# Patient Record
Sex: Female | Born: 1935 | Race: White | Hispanic: No | Smoking: Never smoker
Health system: Southern US, Community
[De-identification: ages and names within clinical notes are randomized; demographics above are authoritative.]

## PROBLEM LIST (undated history)

## (undated) DIAGNOSIS — M810 Age-related osteoporosis without current pathological fracture: Secondary | ICD-10-CM

## (undated) DIAGNOSIS — J189 Pneumonia, unspecified organism: Secondary | ICD-10-CM

## (undated) DIAGNOSIS — C189 Malignant neoplasm of colon, unspecified: Secondary | ICD-10-CM

## (undated) DIAGNOSIS — I10 Essential (primary) hypertension: Secondary | ICD-10-CM

## (undated) DIAGNOSIS — M1990 Unspecified osteoarthritis, unspecified site: Secondary | ICD-10-CM

## (undated) DIAGNOSIS — K219 Gastro-esophageal reflux disease without esophagitis: Secondary | ICD-10-CM

## (undated) DIAGNOSIS — F419 Anxiety disorder, unspecified: Secondary | ICD-10-CM

## (undated) HISTORY — DX: Essential (primary) hypertension: I10

## (undated) HISTORY — DX: Age-related osteoporosis without current pathological fracture: M81.0

## (undated) HISTORY — PX: VAGINAL HYSTERECTOMY: SUR661

## (undated) HISTORY — PX: LAPAROSCOPIC CHOLECYSTECTOMY: SUR755

## (undated) HISTORY — DX: Malignant neoplasm of colon, unspecified: C18.9

---
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
## 1978-12-10 HISTORY — PX: COLECTOMY: SHX59

## 1981-12-10 HISTORY — PX: HAND NERVE REPAIR: SHX1728

## 1996-12-10 HISTORY — PX: RECTAL SURGERY: SHX760

## 1997-12-10 HISTORY — PX: KNEE ARTHROSCOPY: SHX127

## 2001-03-14 ENCOUNTER — Encounter: Payer: Self-pay | Admitting: General Surgery

## 2001-03-19 ENCOUNTER — Ambulatory Visit (HOSPITAL_COMMUNITY): Admission: RE | Admit: 2001-03-19 | Discharge: 2001-03-19 | Payer: Self-pay | Admitting: General Surgery

## 2001-03-19 ENCOUNTER — Encounter (INDEPENDENT_AMBULATORY_CARE_PROVIDER_SITE_OTHER): Payer: Self-pay | Admitting: Specialist

## 2007-01-16 ENCOUNTER — Ambulatory Visit: Payer: Self-pay | Admitting: Cardiology

## 2007-01-24 ENCOUNTER — Ambulatory Visit: Payer: Self-pay | Admitting: Cardiology

## 2007-02-17 ENCOUNTER — Ambulatory Visit: Payer: Self-pay | Admitting: Cardiology

## 2007-06-05 ENCOUNTER — Ambulatory Visit: Payer: Self-pay | Admitting: Cardiology

## 2007-09-08 ENCOUNTER — Ambulatory Visit: Payer: Self-pay | Admitting: Cardiology

## 2008-12-10 HISTORY — PX: CATARACT EXTRACTION W/ INTRAOCULAR LENS  IMPLANT, BILATERAL: SHX1307

## 2011-04-24 NOTE — Assessment & Plan Note (Signed)
[facility]                          [facility] NOTE   NAME:[name], [name] [name]                MRN:          [id number]  DATE:09/08/2007                            DOB:          December 09, 1932    HISTORY OF PRESENT ILLNESS:  The patient is a 77 year old female with a  history of abnormal stress test.  The patient is felt to have a possible  ischemic cardiomyopathy, although stress test was low risk, and the  patient has, in the past, deferred a cardiac catheterization.  She is  doing well from a cardiovascular perspective.  She does report right-  sided chest pain which starts underneath the right breast and radiates  to the back.  She states it is worse on rotational movements and when  she does cooking, particularly when she rotates her body to the right  side.  There is no definite exertional chest pain, nor is there  exertional dyspnea.  The patient reports no palpitations or syncope or  diaphoresis.   MEDICATIONS:  1. Evista 60 mg p.o. daily.  2. Fluoxetine 20 mg p.o. daily.  3. Os-Cal 500 mg tablets x1 daily.  4. Vitamin E 400 mg daily.  5. Prilosec over the counter.  6. Lisinopril 20 mg p.o. b.i.d.  7. Bisoprolol hydrochlorothiazide 2.5/6.25 two q.a.m.  8. Aspirin 81 mg p.o. daily.   PHYSICAL EXAMINATION:  VITAL SIGNS:  Blood pressure 123/79, heart rate  56 beats per minute.  GENERAL:  A well-nourished white female in no apparent distress.  HEENT:  Pupils isocoric.  Conjunctivae clear.  NECK:  Normal carotid upstrokes, no carotid bruits.  LUNGS:  Clear breath sounds bilaterally.  HEART:  Regular rate and rhythm.  Normal S1, S2.  No murmurs, rubs, or  gallops.  ABDOMEN:  Soft, nontender, no rebound or guarding.  Good bowel sounds.  EXTREMITY EXAM:  No cyanosis, clubbing, or edema.  NEURO:  Patient alert, oriented and grossly nonfocal.   PROBLEM LIST:  1. Presumed ischemic cardiomyopathy but low risk.  2. The patient  deferred cardiac catheterization in the past.  3. Asymptomatic from a cardiovascular perspective at the time.  4. Right-sided flank pain, rule out spinal injury/entrapment      neuropathy.  5. Chronic __________.  6. Dyslipidemia.   PLAN:  1. The patient will have an x-ray done of the spine.  2. I do not think the patient's pain is related to cardiac issues.      She is quite functional and she      has no exertional symptoms.  3. The patient will follow up with [place] in 6 months.     Learta Codding, MD,FACC  Electronically Signed    GED/MedQ  DD: 09/08/2007  DT: 09/08/2007  Job #: [id number]

## 2011-04-24 NOTE — Assessment & Plan Note (Signed)
[facility]                          [facility] NOTE   NAME:[name], [name] POPESCU                MRN:          [id number]  DATE:06/05/2007                            DOB:          1932/06/24    PRIMARY CARDIOLOGIST:  Learta Codding, MD, [facility]   REASON FOR VISIT:  Scheduled clinic followup. Please refer to Dr.  Margarita Mail previous clinic note of March 10, for full details.   The patient returns to the clinic for continued close monitoring and  management of presumed coronary artery disease. When last seen in the  clinic, Dr.  Andee Lineman noted that the patient most likely had underlying  coronary artery disease noting a wall motion abnormality both at rest  and on exertion with a dobutamine stress echocardiogram. Ejection  fraction was estimated at 40% at baseline. Nevertheless, this was felt  to exhibit no definite evidence of ischemia and that overall it was a  low risk study.   Medications were adjusted, however, with substitution of Zestril with  lisinopril 20 daily and Toprol with hydrochlorothiazide 25 daily. She  subsequently had further adjustment with up titration of lisinopril 20  b.i.d. for better blood pressure control. Followup blood work showed  normal renal function.   Clinically, the patient remains stable with no interim worsening of the  exertional chest weakness with which she initially presented to [place]  back in February of this year. Additionally, she denies that she  experiences anything which would be characterized as chest pain.   The patient also closely monitors her blood pressure at home and notes  significant improvement with our recent adjustment of her medication  regimen.   CURRENT MEDICATIONS:  1. Aspirin 81 daily.  2. Bisoprolol/hydrochlorothiazide 25/6.25 mg two tablets q a.m.  3. Lisinopril 20 mg b.i.d.  4. Prilosec over-the-counter 20 mg daily.  5. Fluoxetine 20 mg daily.  6. Evista 60 mg daily.   PHYSICAL EXAMINATION:  Blood pressure 117/74, pulse 60 and regular,  weight is 144.  GENERAL:  A 77 year old female sitting upright in no distress.  HEENT: Normocephalic, atraumatic.  NECK: Palpable bilateral carotid pulses without bruits; no JVD at 90  degrees.  LUNGS:  Clear to auscultation in all fields.  HEART: Regular rate and rhythm (S1, S2). No significant murmurs.  ABDOMEN: Benign.  EXTREMITIES: Palpable pulses with trace edema.  NEURO: No focal deficit.   IMPRESSION:  1. Presumed ischemic cardiomyopathy.      a.     Ejection fraction of 40% by recent low risk dobutamine       stress echo February 2008.      b.     No definite ischemia.  2. Hypertension.      a.     Much improved on current medication regimen.  3. Chronic left bundle branch block.  4. Hyperlipidemia.   PLAN:  The patient continues to remain hesitant about proceeding with a  diagnostic cardiac catheterization for definitive exclusion of  significant coronary artery disease. However, she has agreed to closely  monitor herself for any worsening of her symptoms and to contact our  office immediately. In the  interim, she has agreed to take a  prescription for nitroglycerin in the event that she were to have chest  pain unrelieved by rest.   We will continue closely monitoring her here in the clinic and will plan  on having her return in approximately 3 months for a followup. At that  time, we may consider adding a statin to her current regimen given her  most recent lipid profile revealing an LDL of 107 with an HDL of 54 and  a total cholesterol of 183.      Gene Serpe, PA-C  Electronically Signed      Learta Codding, MD,FACC  Electronically Signed   GS/MedQ  DD: 06/05/2007  DT: 06/05/2007  Job #: [id number]   cc:   Doreen Beam

## 2011-04-25 ENCOUNTER — Other Ambulatory Visit (HOSPITAL_COMMUNITY): Payer: Self-pay | Admitting: Internal Medicine

## 2011-04-25 DIAGNOSIS — IMO0002 Reserved for concepts with insufficient information to code with codable children: Secondary | ICD-10-CM

## 2011-04-26 ENCOUNTER — Ambulatory Visit (HOSPITAL_COMMUNITY)
Admission: RE | Admit: 2011-04-26 | Discharge: 2011-04-26 | Disposition: A | Discharge status: Home / Self Care | Payer: Medicare Other | Source: Ambulatory Visit | Attending: Internal Medicine | Admitting: Internal Medicine

## 2011-04-26 DIAGNOSIS — IMO0002 Reserved for concepts with insufficient information to code with codable children: Secondary | ICD-10-CM

## 2011-04-27 ENCOUNTER — Other Ambulatory Visit (HOSPITAL_COMMUNITY): Payer: Self-pay | Admitting: Interventional Radiology

## 2011-04-27 DIAGNOSIS — IMO0002 Reserved for concepts with insufficient information to code with codable children: Secondary | ICD-10-CM

## 2011-04-27 NOTE — Assessment & Plan Note (Signed)
[facility]                          [facility]   NAME:[name] [name], [name] [name]                MRN:          [id number]  DATE:01/16/2007                            DOB:          05-Oct-1932    REFERRING PHYSICIAN:  Dhruv Vyas   REASON FOR CONSULTATION:  [name] [name] is 77 year old female, with no  documented history of coronary artery disease, but with reportedly  negative pharmacologic stress test approximately 10 years ago. She has  not had any subsequent followup in our clinic since then. She is now  referred for evaluation of exertional dyspnea and atypical chest pain.   The patient is a somewhat difficult historian and refers to a sensation  described as a weakness in her chest, which appears to be precipitated  by prolonged standing (for example in the kitchen after preparing a  large meal). When queried specifically about any exertion-related chest  discomfort, however, she is not able to recall any such symptoms  ascribed to any type of activity. In fact, she continues to walk on a  regular basis, albeit at a moderately slow speed.   The patient denies any prior history of myocardial infarction,  congestive heart failure, or stroke. She does, however, tell me that she  has had a bundle branch for the past 15 years or so. Of note, however,  we have no old EKGs for comparison.   ALLERGIES:  SULFA.   CURRENT MEDICATIONS:  1. Toprol XL 50 daily.  2. Zestril daily.  3. Fluoxetine 20 daily.  4. Evista 60 daily.  5. Prilosec OTC 20 daily.   PAST MEDICAL HISTORY:  1. History of right lung nodule.      a.     Followed by Dr. Sherril Croon.  2. History of colon cancer.  3. Osteoporosis.  4. Hypertension.  5. GERD.  6. Arthritis.  7. Remote hemorrhoidectomy.  8. Laparoscopic cholecystectomy 2006.  9. Chronic left bundle branch block.  10.History of hyperlipidemia.   SOCIAL HISTORY:  The patient lives alone. She has one daughter  and two  grandchildren. She has never smoked tobacco on a regular basis. Denies  alcohol use.   FAMILY HISTORY:  Mother deceased age 31, unknown causes but with history  of diabetes mellitus and peripheral vascular disease. Father deceased  secondary to prostate cancer.   REVIEW OF SYSTEMS:  As noted per HPI, occasional heartburn symptoms, no  PND, orthopnea, or lower extremity edema. Remaining systems negative.   PHYSICAL EXAMINATION:  VITAL SIGNS:  Blood pressure 158/90, pulse 60,  regular, weight 139.8.  GENERAL:  A 77 year old female sitting upright in no distress.  HEENT:  Normocephalic, atraumatic.  NECK:  Palpable bilateral carotid pulses without bruits; no JVD.  LUNGS:  Clear to auscultation bilaterally.  HEART:  Regular rate and rhythm (S1, S2), no significant murmurs. No  rubs or gallops.  ABDOMEN:  Soft, nontender with intact bowel sounds.  EXTREMITIES:  Palpable bilateral femoral pulses and distal pulses with  trace pedal edema.  NEUROLOGIC:  Flat affect, but no focal deficit.   Electrocardiogram  today reveals sinus bradycardia at 54 bpm with left  axis deviation/LAHB; left bundle branch block.   IMPRESSION:  [name] [name] is a 77 year old female, with no documented  history of coronary artery disease, but several cardiac risk factors,  and reportedly negative prior ischemic workup with remote pharmacologic  stress test, now referred for evaluation of possible chest discomfort.   The patient presents with an abnormal electrocardiogram revealing left  bundle branch block, which is reportedly chronic, but for which we have  no previous EKGs for comparison.   PLAN:  Recommendation is to proceed with further workup with a  dobutamine echocardiogram for risk stratification. This will also  provide assessment of left ventricular function and rule out of any  underlying structural abnormalities. At that time, we will also check a  fasting lipid profile. We  will also  start the patient on low-dose aspirin. We will then arrange  for patient to return to our clinic for followup and review of study  results, and further recommendations.     Gene Serpe, PA-C  Electronically Signed      Learta Codding, MD,FACC  Electronically Signed   GS/MedQ  DD: 01/16/2007  DT: 01/16/2007  Job #: [id number]   cc:   Doreen Beam

## 2011-04-27 NOTE — Op Note (Signed)
[facility]  Patient:    [name] [name], [name] [name]                MRN: [id number]. Date: 03/19/01 Adm. Date:  [id number] Attending:  Carson Myrtle                           Operative Report  PREOPERATIVE DIAGNOSES:  1. Anal stenosis. 2. Hemorrhoids.  POSTOPERATIVE DIAGNOSES:  1. Anal stenosis. 2. Hemorrhoids.  OPERATIVE PROCEDURE:  1. Examination under anesthesia. 2. Hemorrhoidectomy. 3. Dilation of anal stenosis.  SURGEON:  Timothy E. Earlene Plater, M.D.  ANESTHESIA:  C.R.N.A. supervised Lestine Box, M.D.  INDICATIONS:  [name] [name] has been seen and followed in the office. She has had previous minor rectal procedures and she presents with a recurrence of anal stenosis now of a degree that interferes with her daily activity and bowel elimination. Since she has stenosis, it is very difficult to treat her in the office and she has elected to proceed with surgical intervention. She also has a large external hemorrhoid that she wanted removed at the same time.   DESCRIPTION OF PROCEDURE:  The patient is brought to the operating room, placed supine and LMA anesthesia provided. She was placed in lithotomy and perianal area inspected and prepped and draped in the usual fashion.  A large external hemorrhoid was present in the right anterior position. Anal stenosis was moderate to severe. I injected round and about with 0.50% Marcaine with epinephrine and massaged it in well. I was able to dilate to 2 fingers but could not insert the adult anoscope. I therefore did a left posterior internal sphincterotomy with careful dilatation. The anoscope was then accepted. A prolapsed mucosal fold was present in the anterior position of the rectal mucosa. I removed the external hemorrhoid in a circumferential fashion so as not to cross the anoderm and risk scarring and more stenosis. I undermined those edges and closed the wound circumferentially as well.  This was done with a 3-0 chromic. I then used a 2-0 chromic and oversewed the anterior mucosal fold so as to treat it like a rubber band ligation. This completed the procedure. There were no complications and no other pathology noted. Gelfoam, gauze and dry, sterile dressing applied. She tolerated it well and was removed to the recovery room in good condition.  DISPOSITION:  Written and verbal instructions were given to her and her daughter. She will be seen and followed as an outpatient.DD:  03/19/01 TD:  03/19/01 Job: [id number]

## 2011-04-27 NOTE — Assessment & Plan Note (Signed)
[facility]                          [facility]   NAME:Bathe, KIERRE DEINES                MRN:          [id number]  DATE:02/17/2007                            DOB:          [birth year]    HISTORY OF PRESENT ILLNESS:  The patient is a 77 year old female with no  history of documented coronary artery disease.  She was last seen by  Gene Serpe in the office on January 16, 2007.  She was found to have a  left bundle branch block.  This had been documented several years ago.  The patient described a sensation of weakness in the chest as well as  some exertional dyspnea.  A stress echocardiographic study was performed  demonstrating baseline abnormal LV function, ejection fraction 40%.  However, there was no definite ischemia seen with basically overall low-  risk study.  I did feel that the patient developed coronary artery  disease due to an underlying wall motion abnormality which was present  both at rest and on exertion.   The patient presents now for followup.  She states she has been doing  well.  Her blood pressure, however, appears to be uncontrolled.  Blood  pressure was noted to be 147/74 mmHg, heart rate 60 beats per minute.   PHYSICAL EXAMINATION:  VITAL SIGNS: Blood pressure 147/74, heart rate  60.  Weight 142 pounds.  NECK:  Normal carotid upstroke, no carotid bruits.  LUNGS:  Clear breath sounds bilaterally.  HEART:  Regular rate and rhythm.  Normal S1 and S2.  No murmur or  gallops.  ABDOMEN:  Soft, nontender without rebound or guarding.  Good bowel  sounds.  EXTREMITIES:  No cyanosis, clubbing, or edema.  NEUROLOGIC: The patient is alert and oriented and grossly nonfocal.   PROBLEM LIST:  1. Abnormal dobutamine echocardiographic study.      a.     Ejection fraction 40%.      b.     Low risk with no new wall motion abnormalities.  2. Dyspnea.  3. Hypertension, poorly controlled.  4. Left bundle branch block,  chronic.   PLAN:  1. The patient likely has coronary artery disease, but she is actually      doing quite well.  She has mild dyspnea.  2. No definite evidence of active ischemia with chest pain.  I feel      the patient can be started on aspirin.  3. The patient's medical therapy should be modified, and we will add      hydrochlorothiazide to her medical regimen for better blood      pressure control.  4. I changed Zestril to lisinopril 20 a day and Toprol to      hydrochlorothiazide 25 mg p.o. daily.  5. The patient will follow up with [place] in the next couple of months. If      she becomes more symptomatic, sooner.  Catheterization can be      entertained at that time.     Learta Codding, MD,FACC  Electronically Signed    GED/MedQ  DD: 02/17/2007  DT: 02/17/2007  Job #: [id number]

## 2011-05-01 ENCOUNTER — Ambulatory Visit (HOSPITAL_COMMUNITY)
Admission: RE | Admit: 2011-05-01 | Discharge: 2011-05-01 | Disposition: A | Discharge status: Home / Self Care | Payer: Medicare Other | Source: Ambulatory Visit | Attending: Interventional Radiology | Admitting: Interventional Radiology

## 2011-05-01 ENCOUNTER — Other Ambulatory Visit (HOSPITAL_COMMUNITY): Payer: Self-pay | Admitting: Interventional Radiology

## 2011-05-01 DIAGNOSIS — IMO0002 Reserved for concepts with insufficient information to code with codable children: Secondary | ICD-10-CM

## 2011-05-01 DIAGNOSIS — X58XXXA Exposure to other specified factors, initial encounter: Secondary | ICD-10-CM | POA: Insufficient documentation

## 2011-05-01 DIAGNOSIS — S22009A Unspecified fracture of unspecified thoracic vertebra, initial encounter for closed fracture: Secondary | ICD-10-CM | POA: Insufficient documentation

## 2011-05-01 DIAGNOSIS — Z79899 Other long term (current) drug therapy: Secondary | ICD-10-CM | POA: Insufficient documentation

## 2011-05-01 DIAGNOSIS — Z01812 Encounter for preprocedural laboratory examination: Secondary | ICD-10-CM | POA: Insufficient documentation

## 2011-05-01 LAB — CBC
HCT: 38.3 % (ref 36.0–46.0)
Hemoglobin: 13.1 g/dL (ref 12.0–15.0)
MCH: 30.8 pg (ref 26.0–34.0)
MCHC: 34.2 g/dL (ref 30.0–36.0)
MCV: 90.1 fL (ref 78.0–100.0)
Platelets: 288 10*3/uL (ref 150–400)
RBC: 4.25 MIL/uL (ref 3.87–5.11)
RDW: 12.8 % (ref 11.5–15.5)
WBC: 5 10*3/uL (ref 4.0–10.5)

## 2011-05-01 LAB — POCT I-STAT, CHEM 8
BUN: 15 mg/dL (ref 6–23)
Calcium, Ion: 1.19 mmol/L (ref 1.12–1.32)
Chloride: 105 mEq/L (ref 96–112)
Potassium: 3.9 mEq/L (ref 3.5–5.1)

## 2011-05-01 LAB — APTT: aPTT: 29 seconds (ref 24–37)

## 2011-05-01 MED ORDER — IOHEXOL 300 MG/ML  SOLN: 50.0000 mL | Freq: Once | INTRAMUSCULAR | Status: AC | PRN

## 2011-05-15 ENCOUNTER — Other Ambulatory Visit (HOSPITAL_COMMUNITY): Payer: Self-pay | Admitting: Interventional Radiology

## 2011-05-15 DIAGNOSIS — IMO0002 Reserved for concepts with insufficient information to code with codable children: Secondary | ICD-10-CM

## 2011-05-18 ENCOUNTER — Ambulatory Visit (HOSPITAL_COMMUNITY)
Admission: RE | Admit: 2011-05-18 | Discharge: 2011-05-18 | Disposition: A | Discharge status: Home / Self Care | Payer: Medicare Other | Source: Ambulatory Visit | Attending: Interventional Radiology | Admitting: Interventional Radiology

## 2011-05-18 DIAGNOSIS — IMO0002 Reserved for concepts with insufficient information to code with codable children: Secondary | ICD-10-CM

## 2012-06-18 ENCOUNTER — Telehealth (HOSPITAL_COMMUNITY): Payer: Self-pay

## 2012-06-18 NOTE — Telephone Encounter (Signed)
[name] [name] called the office today.  She stated that her regular doctor took xrays that didn't show any type of new compression fracture but, she stated that her back is hurting.  After asking her where the pain was, she could not  Specify an exact spot  I asked her was it above or below her bra strap.. She stated I dont know.  Then she stated that her neck hurt.  I asked her was it at the base of her neck to her bra strap or higher than her shoulders.  She stated I dont know.  She said she would give it a couple of days and possibly call back.  I told her to just give [place] a call.

## 2012-08-18 ENCOUNTER — Other Ambulatory Visit (HOSPITAL_COMMUNITY): Payer: Self-pay | Admitting: Interventional Radiology

## 2012-08-18 DIAGNOSIS — M549 Dorsalgia, unspecified: Secondary | ICD-10-CM

## 2012-08-19 ENCOUNTER — Other Ambulatory Visit (HOSPITAL_COMMUNITY): Payer: Self-pay | Admitting: Interventional Radiology

## 2012-08-19 ENCOUNTER — Ambulatory Visit (HOSPITAL_COMMUNITY)
Admission: RE | Admit: 2012-08-19 | Discharge: 2012-08-19 | Disposition: A | Discharge status: Home / Self Care | Payer: Medicare Other | Source: Ambulatory Visit | Attending: Interventional Radiology | Admitting: Interventional Radiology

## 2012-08-19 ENCOUNTER — Telehealth (HOSPITAL_COMMUNITY): Payer: Self-pay

## 2012-08-19 DIAGNOSIS — IMO0002 Reserved for concepts with insufficient information to code with codable children: Secondary | ICD-10-CM

## 2012-08-19 DIAGNOSIS — M549 Dorsalgia, unspecified: Secondary | ICD-10-CM

## 2012-08-19 NOTE — Telephone Encounter (Signed)
The pt will be having a Bone Scan at [facility] 08-20-12.   Dr. Corliss Skains will be reviewing the results.

## 2012-08-20 ENCOUNTER — Encounter (HOSPITAL_COMMUNITY): Payer: Self-pay

## 2012-08-20 ENCOUNTER — Ambulatory Visit (HOSPITAL_COMMUNITY)
Admission: RE | Admit: 2012-08-20 | Discharge: 2012-08-20 | Disposition: A | Discharge status: Home / Self Care | Payer: Medicare Other | Source: Ambulatory Visit | Attending: Interventional Radiology | Admitting: Interventional Radiology

## 2012-08-20 ENCOUNTER — Encounter (HOSPITAL_COMMUNITY)
Admission: RE | Admit: 2012-08-20 | Discharge: 2012-08-20 | Disposition: A | Discharge status: Home / Self Care | Payer: Medicare Other | Source: Ambulatory Visit | Attending: Interventional Radiology | Admitting: Interventional Radiology

## 2012-08-20 DIAGNOSIS — M545 Low back pain, unspecified: Secondary | ICD-10-CM | POA: Insufficient documentation

## 2012-08-20 DIAGNOSIS — M549 Dorsalgia, unspecified: Secondary | ICD-10-CM

## 2012-08-20 DIAGNOSIS — IMO0002 Reserved for concepts with insufficient information to code with codable children: Secondary | ICD-10-CM

## 2012-08-20 MED ORDER — TECHNETIUM TC 99M MEDRONATE IV KIT
25.0000 | PACK | Freq: Once | INTRAVENOUS | Status: AC | PRN
  Administered 2012-08-20: 25 via INTRAVENOUS

## 2012-08-26 ENCOUNTER — Telehealth (HOSPITAL_COMMUNITY): Payer: Self-pay

## 2012-08-26 NOTE — Telephone Encounter (Signed)
I spoke with [name] [name] in regards to her Bone scan.  Per Dr. Corliss Skains there are no new fractures.  He advised for the pt to rest for a few weeks.  If her pain is not any better, go to her ortho.

## 2013-04-14 ENCOUNTER — Ambulatory Visit (INDEPENDENT_AMBULATORY_CARE_PROVIDER_SITE_OTHER): Payer: Medicare Other | Admitting: General Surgery

## 2013-04-14 ENCOUNTER — Encounter (INDEPENDENT_AMBULATORY_CARE_PROVIDER_SITE_OTHER): Payer: Self-pay | Admitting: General Surgery

## 2013-04-14 VITALS — BP 122/70 | HR 60 | Temp 97.5°F | Resp 12 | Ht 61.0 in | Wt 139.0 lb

## 2013-04-14 DIAGNOSIS — K6289 Other specified diseases of anus and rectum: Secondary | ICD-10-CM

## 2013-04-14 DIAGNOSIS — K5900 Constipation, unspecified: Secondary | ICD-10-CM | POA: Insufficient documentation

## 2013-04-14 NOTE — Patient Instructions (Signed)
Continue Miralax.  Call the office if your symptoms get worse.

## 2013-04-14 NOTE — Progress Notes (Signed)
Chief Complaint  Patient presents with  . Rectal Problems    HISTORY: [name] [name] is a 79 y.o. female who presents to the office with rectal pain.  She has a history of constipation, but this is better since being on miralax.  She is s/p surgery by Dr Earlene Plater in the late 90's after an episode and severe anal pain.  This was better until the last few months.  She was place on diltiazem cream by Dr Clent Ridges but she had an episode of hypotension with this and stopped.  Constipation makes the symptoms worse.   It is intermittent in nature.  Her bowel habits are regular and her bowel movements are soft.  Her fiber intake is moderate.  She has a feeling of incomplete evacuation at times.  She was having some fecal leakage as well, but this has gotten better.  She denies prolapsing tissue.     Past Medical History  Diagnosis Date  . Arthritis   . Osteoporosis   . Cancer     colon  . Hypertension       Past Surgical History  Procedure Laterality Date  . Cholecystectomy    . Colon surgery  1980  . Rectal surgery  1998  . Cataract extraction  2010  . Knee surgery  1999  . Hand surgery  1983        Current Outpatient Prescriptions  Medication Sig Dispense Refill  . bisoprolol-hydrochlorothiazide (ZIAC) 2.5-6.25 MG per tablet Take 2 tablets by mouth daily.      . calcium-vitamin D (OSCAL WITH D) 500-200 MG-UNIT per tablet Take 1 tablet by mouth daily.      . Cholecalciferol (VITAMIN D-3 PO) Take 2,000 mg by mouth daily.      Marland Kitchen HYDROcodone-acetaminophen (NORCO/VICODIN) 5-325 MG per tablet Take 1 tablet by mouth every 6 (six) hours as needed for pain.      Marland Kitchen lisinopril (PRINIVIL,ZESTRIL) 20 MG tablet Take 20 mg by mouth 2 (two) times daily.      Marland Kitchen LORazepam (ATIVAN) 1 MG tablet Take 1 mg by mouth every 8 (eight) hours as needed for anxiety.      Marland Kitchen omeprazole (PRILOSEC) 20 MG capsule Take 20 mg by mouth daily.      . polyethylene glycol (MIRALAX / GLYCOLAX) packet Take 17 g by mouth  daily. One tablespoon a day      . vitamin E (VITAMIN E) 400 UNIT capsule Take 400 Units by mouth daily.       No current facility-administered medications for this visit.      Allergies  Allergen Reactions  . Sulfa Drugs Cross Reactors Hives      No family history on file.  History   Social History  . Marital Status: Divorced    Spouse Name: N/A    Number of Children: N/A  . Years of Education: N/A   Social History Main Topics  . Smoking status: Never Smoker   . Smokeless tobacco: None  . Alcohol Use: No  . Drug Use: No  . Sexually Active: None   Other Topics Concern  . None   Social History Narrative  . None      REVIEW OF SYSTEMS - PERTINENT POSITIVES ONLY: Review of Systems - General ROS: negative for - chills, fever or weight loss Hematological and Lymphatic ROS: negative for - bleeding problems, blood clots or bruising Respiratory ROS: no cough, shortness of breath, or wheezing Cardiovascular ROS: no chest pain or dyspnea on exertion  Gastrointestinal ROS: no abdominal pain, change in bowel habits, or black or bloody stools Genito-Urinary ROS: no dysuria, trouble voiding, or hematuria  EXAM: Filed Vitals:   04/14/13 1101  BP: 122/70  Pulse: 60  Temp: 97.5 F (36.4 C)  Resp: 12    General appearance: alert and cooperative Resp: clear to auscultation bilaterally Cardio: regular rate and rhythm GI: soft, non-tender; bowel sounds normal; no masses,  no organomegaly  Anal exam Findings: large external skin tags, increased sphincter tone, no masses palapated, anal canal full of stool.  Unable to perform anoscopy.    ASSESSMENT AND PLAN: [name] [name] is a 79 y.o. F who has a longstanding history of constipation.  She has been having anal pain over the last few months, but this has gotten better on low dose Miralax.  Her physical exam is consistent with outlet obstruction.  Given that her symptoms are getting better with the miralax, it may be  better to continue on this path for now.  If her symptoms worsen, we could get a MR defagram to evaluate her pelvic physiology better.  If her anal pain gets worse, I will need to see her back and have her do an enema prior to the visit so that I can do a complete anoscopy.      Vanita Panda, MD Colon and Rectal Surgery / General Surgery [facility]      Visit Diagnoses: 1. Unspecified constipation   2. Anal pain     Primary Care Physician: Ignatius Specking., MD

## 2014-12-08 ENCOUNTER — Encounter (INDEPENDENT_AMBULATORY_CARE_PROVIDER_SITE_OTHER): Payer: Self-pay | Admitting: *Deleted

## 2015-01-13 ENCOUNTER — Ambulatory Visit (INDEPENDENT_AMBULATORY_CARE_PROVIDER_SITE_OTHER): Payer: Medicare Other | Admitting: Internal Medicine

## 2015-03-15 ENCOUNTER — Ambulatory Visit (INDEPENDENT_AMBULATORY_CARE_PROVIDER_SITE_OTHER): Payer: Medicare Other | Admitting: Internal Medicine

## 2015-03-15 ENCOUNTER — Encounter (INDEPENDENT_AMBULATORY_CARE_PROVIDER_SITE_OTHER): Payer: Self-pay | Admitting: Internal Medicine

## 2015-03-15 VITALS — BP 130/62 | HR 60 | Temp 97.5°F | Ht 60.0 in | Wt 127.0 lb

## 2015-03-15 DIAGNOSIS — K589 Irritable bowel syndrome without diarrhea: Secondary | ICD-10-CM | POA: Diagnosis not present

## 2015-03-15 DIAGNOSIS — K624 Stenosis of anus and rectum: Secondary | ICD-10-CM | POA: Diagnosis not present

## 2015-03-15 DIAGNOSIS — F5000 Anorexia nervosa, unspecified: Secondary | ICD-10-CM

## 2015-03-15 NOTE — Progress Notes (Signed)
Subjective:    Patient ID: [name] [name], female    DOB: 06/12/1932, 81 y.o.   MRN: [id number]  HPI Referred by Dr. Woody Seller for abdominal pain/IBS. She tells me around the first of the year she had abdominal pain which has now resolved. She tells me she has to strain to have a BM. Her stool are small.  Some times she passes feces when she voids. She has a hx of anal stenosis.  When she strains, she will have abdominal pain which does resolve. She usually has a BM daily. Her stools are soft. She takes Miralax daily.  She does not have to strain every time to have a BM.  She tells every since she had the virus in January she has to force her self to eat. She denies any epigastric pain.  She denies seeing any [facility] on melena.  Recent colonoscopy in 2015 normal except for a polyp. No biopsy sent.   Hx of colon cancer in 1980 at [facility] by Dr. Alfonse Ras.  Has been evaluated by Leighton Ruff MD at [facility] for constipation in 04/2013.   02/13/2015 H and H 13.6 and 39.6. Bili 0.6, ALP 94, AST 26, ALT 12.   06/28/2014 Colonoscopy: High risk colon cancer surveillance. Personal hx of colon cancer. Last colonoscopy 2009. [name] [name]. [place].  Anal stricture found on digital rectal exam. One 17mm polyp in the transverse colond.  Resected and retrieved.  Patient end to end colo-rectal anastomosis.  The distal rectum and anal verge are normal on rectflexion view.    03/19/2001 [name] MD. Anal stenosis. 2. Hemorrhoids A large external hemorrhoid was present in the right anterior position. Anal stenosis was moderate to severe. I injected round and about with 0.50% Marcaine with epinephrine and massaged it in well. I was able to dilate to 2 fingers but could not insert the adult anoscope. I therefore did a left posterior internal sphincterotomy with careful dilatation. The anoscope was then accepted. A prolapsed mucosal fold was present in the anterior position of the  rectal mucosa. I removed the external hemorrhoid in a circumferential fashion so as not to cross the anoderm and risk scarring and more stenosis. I undermined those edges and closed the wound circumferentially as well.    Review of Systems  Divorced. One child in good health.  Past Medical History  Diagnosis Date  . Arthritis   . Osteoporosis   . Cancer     colon  . Hypertension   . Colon cancer     1980    Past Surgical History  Procedure Laterality Date  . Cholecystectomy    . Colon surgery  1980  . Rectal surgery  1998  . Cataract extraction  2010  . Knee surgery  1999  . Hand surgery  1983    Allergies  Allergen Reactions  . Penicillins   . Sulfa Drugs Cross Reactors Hives    Current Outpatient Prescriptions on File Prior to Visit  Medication Sig Dispense Refill  . bisoprolol-hydrochlorothiazide (ZIAC) 2.5-6.25 MG per tablet Take 2 tablets by mouth daily.    . calcium-vitamin D (OSCAL WITH D) 500-200 MG-UNIT per tablet Take 1 tablet by mouth daily.    . Cholecalciferol (VITAMIN D-3 PO) Take 2,000 mg by mouth daily.    Marland Kitchen HYDROcodone-acetaminophen (NORCO/VICODIN) 5-325 MG per tablet Take 1 tablet by mouth every 6 (six) hours as needed for pain.    Marland Kitchen lisinopril (PRINIVIL,ZESTRIL) 20 MG tablet Take 20  mg by mouth 2 (two) times daily. 1/2 tab twice a day.    Marland Kitchen LORazepam (ATIVAN) 1 MG tablet Take 1 mg by mouth every 8 (eight) hours as needed for anxiety.    Marland Kitchen omeprazole (PRILOSEC) 20 MG capsule Take 20 mg by mouth daily.    . polyethylene glycol (MIRALAX / GLYCOLAX) packet Take 17 g by mouth daily. One tablespoon a day    . vitamin E (VITAMIN E) 400 UNIT capsule Take 400 Units by mouth daily.     No current facility-administered medications on file prior to visit.        Objective:   Physical Exam Blood pressure 130/62, pulse 60, temperature 97.5 F (36.4 C), height 5' (1.524 m), weight 127 lb (57.607 kg). Alert and oriented. Skin warm and dry. Oral mucosa is  moist.   . Sclera anicteric, conjunctivae is pink. Thyroid not enlarged. No cervical lymphadenopathy. Lungs clear. Heart regular rate and rhythm.  Abdomen is soft. Bowel sounds are positive. No hepatomegaly. No abdominal masses felt. No tenderness.  No edema to lower extremities. Rectal exam: I did not feel a rectal stricture on exam.         Assessment & Plan:  Anal stricture. I really did not feel a stricture today.  OV in 4 weeks. Continue the Miralax. Do not strain to have a BM.

## 2015-03-15 NOTE — Patient Instructions (Addendum)
OV in 4 weeks. Continue the Miralax

## 2015-03-24 ENCOUNTER — Encounter (INDEPENDENT_AMBULATORY_CARE_PROVIDER_SITE_OTHER): Payer: Self-pay

## 2015-04-07 ENCOUNTER — Telehealth (INDEPENDENT_AMBULATORY_CARE_PROVIDER_SITE_OTHER): Payer: Self-pay | Admitting: *Deleted

## 2015-04-07 NOTE — Telephone Encounter (Signed)
Per [name] [name], she is having a lot of back trouble and doesn't see why she needed a f/u at this time. She is also not sure if back surgery is needed. Will call back to get a f/u scheduled once the back issue has been resolved.

## 2015-04-07 NOTE — Telephone Encounter (Signed)
noted 

## 2015-08-20 ENCOUNTER — Emergency Department (HOSPITAL_COMMUNITY)
Admission: EM | Admit: 2015-08-20 | Discharge: 2015-08-20 | Disposition: A | Discharge status: Home / Self Care | Payer: Medicare Other | Attending: Emergency Medicine | Admitting: Emergency Medicine

## 2015-08-20 ENCOUNTER — Encounter (HOSPITAL_COMMUNITY): Payer: Self-pay | Admitting: Emergency Medicine

## 2015-08-20 DIAGNOSIS — Z79899 Other long term (current) drug therapy: Secondary | ICD-10-CM | POA: Insufficient documentation

## 2015-08-20 DIAGNOSIS — Z792 Long term (current) use of antibiotics: Secondary | ICD-10-CM | POA: Insufficient documentation

## 2015-08-20 DIAGNOSIS — G8929 Other chronic pain: Secondary | ICD-10-CM | POA: Insufficient documentation

## 2015-08-20 DIAGNOSIS — M1990 Unspecified osteoarthritis, unspecified site: Secondary | ICD-10-CM | POA: Diagnosis not present

## 2015-08-20 DIAGNOSIS — F419 Anxiety disorder, unspecified: Secondary | ICD-10-CM | POA: Insufficient documentation

## 2015-08-20 DIAGNOSIS — Z880 Allergy status to penicillin: Secondary | ICD-10-CM | POA: Diagnosis not present

## 2015-08-20 DIAGNOSIS — I10 Essential (primary) hypertension: Secondary | ICD-10-CM | POA: Diagnosis not present

## 2015-08-20 DIAGNOSIS — R52 Pain, unspecified: Secondary | ICD-10-CM | POA: Diagnosis present

## 2015-08-20 DIAGNOSIS — M791 Myalgia: Secondary | ICD-10-CM | POA: Insufficient documentation

## 2015-08-20 DIAGNOSIS — Z85038 Personal history of other malignant neoplasm of large intestine: Secondary | ICD-10-CM | POA: Insufficient documentation

## 2015-08-20 LAB — CBC WITH DIFFERENTIAL/PLATELET
BASOS PCT: 0 % (ref 0–1)
Basophils Absolute: 0 10*3/uL (ref 0.0–0.1)
EOS ABS: 0 10*3/uL (ref 0.0–0.7)
Eosinophils Relative: 0 % (ref 0–5)
HCT: 42.3 % (ref 36.0–46.0)
Hemoglobin: 14 g/dL (ref 12.0–15.0)
Lymphocytes Relative: 13 % (ref 12–46)
Lymphs Abs: 0.9 10*3/uL (ref 0.7–4.0)
MCH: 31 pg (ref 26.0–34.0)
MCHC: 33.1 g/dL (ref 30.0–36.0)
MCV: 93.8 fL (ref 78.0–100.0)
MONO ABS: 0.4 10*3/uL (ref 0.1–1.0)
MONOS PCT: 5 % (ref 3–12)
Neutro Abs: 5.6 10*3/uL (ref 1.7–7.7)
Neutrophils Relative %: 82 % — ABNORMAL HIGH (ref 43–77)
Platelets: 238 10*3/uL (ref 150–400)
RBC: 4.51 MIL/uL (ref 3.87–5.11)
RDW: 12.9 % (ref 11.5–15.5)
WBC: 6.9 10*3/uL (ref 4.0–10.5)

## 2015-08-20 LAB — BASIC METABOLIC PANEL
Anion gap: 10 (ref 5–15)
BUN: 15 mg/dL (ref 6–20)
CALCIUM: 9.1 mg/dL (ref 8.9–10.3)
CO2: 25 mmol/L (ref 22–32)
CREATININE: 0.79 mg/dL (ref 0.44–1.00)
Chloride: 104 mmol/L (ref 101–111)
GFR calc non Af Amer: >60 mL/min (ref >60)
Glucose, Bld: 94 mg/dL (ref 65–99)
Potassium: 3.9 mmol/L (ref 3.5–5.1)
SODIUM: 139 mmol/L (ref 135–145)

## 2015-08-20 LAB — URINE MICROSCOPIC-ADD ON

## 2015-08-20 LAB — URINALYSIS, ROUTINE W REFLEX MICROSCOPIC
BILIRUBIN URINE: NEGATIVE
GLUCOSE, UA: NEGATIVE mg/dL
KETONES UR: NEGATIVE mg/dL
Leukocytes, UA: NEGATIVE
NITRITE: NEGATIVE
PH: 8 (ref 5.0–8.0)
Protein, ur: NEGATIVE mg/dL
Specific Gravity, Urine: 1.01 (ref 1.005–1.030)
Urobilinogen, UA: 0.2 mg/dL (ref 0.0–1.0)

## 2015-08-20 LAB — CK: CK TOTAL: 108 U/L (ref 38–234)

## 2015-08-20 MED ORDER — GABAPENTIN 300 MG PO CAPS: 300.0000 mg | ORAL_CAPSULE | Freq: Every day | ORAL | Status: DC

## 2015-08-20 MED ORDER — HYDROCODONE-ACETAMINOPHEN 7.5-325 MG/15ML PO SOLN
10.0000 mL | Freq: Once | ORAL | Status: AC
  Administered 2015-08-20: 10 mL via ORAL
  Filled 2015-08-20: qty 15

## 2015-08-20 MED ORDER — GABAPENTIN 300 MG PO CAPS
300.0000 mg | ORAL_CAPSULE | Freq: Once | ORAL | Status: AC
  Administered 2015-08-20: 300 mg via ORAL
  Filled 2015-08-20: qty 1

## 2015-08-20 NOTE — ED Provider Notes (Signed)
CSN: [id number]     Arrival date & time 08/20/15  1155 History   First MD Initiated Contact with Patient 08/20/15 1207     Chief Complaint  Patient presents with  . Generalized Body Aches      HPI  Patient presents for evaluation for essentially 2 sayings ultimately. She states that she had a panic attack this morning. She states that she has a panic attack because she didn't sleep well last night. She states she didn't sleep well because she had pain in her legs. States she's had pain in her legs for many years. She states that she takes hydrocodone for it normally takes it in the evening in the morning. She saw her doctor on Tuesday for what sounds like multiple complaints. She states ultimately he was told she might have a bladder infection. She states that she was disappointed in that her doctor didn't "pinch my legs to see if the blood flow was okay".  She states that she called yesterday to send her legs were still hurting her. Her doctor called in a prescription for diclofenac cream which she has used before with success. She was going to the grocery as morning to pick Her prescription at the pharmacy when she states she felt like she had a panic attack and had a take after Ativan. She states that she feels like that episode has passed.  He reports that her current episode of pain is been worse since a 30 mile car trip months ago when the air conditioner was really cold on her legs.  Past Medical History  Diagnosis Date  . Arthritis   . Osteoporosis   . Cancer     colon  . Hypertension   . Colon cancer     1980   Past Surgical History  Procedure Laterality Date  . Cholecystectomy    . Colon surgery  1980  . Rectal surgery  1998  . Cataract extraction  2010  . Knee surgery  1999  . Hand surgery  1983   No family history on file. Social History  Substance Use Topics  . Smoking status: Never Smoker   . Smokeless tobacco: None  . Alcohol Use: No   OB History    No data  available     Review of Systems  Constitutional: Negative for fever, chills, diaphoresis, appetite change and fatigue.  HENT: Negative for mouth sores, sore throat and trouble swallowing.   Eyes: Negative for visual disturbance.  Respiratory: Negative for cough, chest tightness, shortness of breath and wheezing.   Cardiovascular: Negative for chest pain.  Gastrointestinal: Negative for nausea, vomiting, abdominal pain, diarrhea and abdominal distention.  Endocrine: Negative for polydipsia, polyphagia and polyuria.  Genitourinary: Negative for dysuria, frequency and hematuria.  Musculoskeletal: Positive for myalgias and arthralgias. Negative for gait problem.  Skin: Negative for color change, pallor and rash.  Neurological: Negative for dizziness, syncope, light-headedness and headaches.  Hematological: Does not bruise/bleed easily.  Psychiatric/Behavioral: Negative for behavioral problems and confusion. The patient is nervous/anxious.       Allergies  Penicillins and Sulfa drugs cross reactors  Home Medications   Prior to Admission medications   Medication Sig Start Date End Date Taking? Authorizing Provider  bisoprolol-hydrochlorothiazide (ZIAC) 2.5-6.25 MG per tablet Take 2 tablets by mouth daily.   Yes Historical Provider, MD  calcium-vitamin D (OSCAL WITH D) 500-200 MG-UNIT per tablet Take 1 tablet by mouth daily.   Yes Historical Provider, MD  Cholecalciferol (VITAMIN D-3 PO)  Take 2,000 mg by mouth daily.   Yes Historical Provider, MD  ciprofloxacin (CIPRO) 500 MG tablet Take 500 mg by mouth 2 (two) times daily.   Yes Historical Provider, MD  HYDROcodone-acetaminophen (NORCO) 7.5-325 MG per tablet Take 1 tablet by mouth every 12 (twelve) hours.   Yes Historical Provider, MD  lisinopril (PRINIVIL,ZESTRIL) 10 MG tablet Take 10 mg by mouth 2 (two) times daily.   Yes Historical Provider, MD  LORazepam (ATIVAN) 1 MG tablet Take 0.5-1 mg by mouth every 8 (eight) hours as needed for  anxiety.    Yes Historical Provider, MD  polyethylene glycol (MIRALAX / GLYCOLAX) packet Take 17 g by mouth daily. One tablespoon a day   Yes Historical Provider, MD  vitamin E (VITAMIN E) 400 UNIT capsule Take 400 Units by mouth daily.   Yes Historical Provider, MD  gabapentin (NEURONTIN) 300 MG capsule Take 1 capsule (300 mg total) by mouth at bedtime. 08/20/15   Tanna Furry, MD   BP 147/68 mmHg  Pulse 90  Temp(Src) 98 F (36.7 C)  Resp 18  Ht 5' (1.524 m)  Wt 125 lb (56.7 kg)  BMI 24.41 kg/m2  SpO2 94% Physical Exam  Constitutional: She is oriented to person, place, and time. She appears well-developed and well-nourished. No distress.  HENT:  Head: Normocephalic.  Eyes: Conjunctivae are normal. Pupils are equal, round, and reactive to light. No scleral icterus.  Neck: Normal range of motion. Neck supple. No thyromegaly present.  Cardiovascular: Normal rate and regular rhythm.  Exam reveals no gallop and no friction rub.   No murmur heard. Pulmonary/Chest: Effort normal and breath sounds normal. No respiratory distress. She has no wheezes. She has no rales.  Abdominal: Soft. Bowel sounds are normal. She exhibits no distension. There is no tenderness. There is no rebound.  Musculoskeletal: Normal range of motion.       Legs: Neurological: She is alert and oriented to person, place, and time.  Skin: Skin is warm and dry. No rash noted.  Psychiatric: She has a normal mood and affect. Her behavior is normal.    ED Course  Procedures (including critical care time) Labs Review Labs Reviewed  URINALYSIS, [place] (NOT AT [facility]) - Abnormal; Notable for the following:    Hgb urine dipstick TRACE (*)    All other components within normal limits  CBC WITH DIFFERENTIAL/PLATELET - Abnormal; Notable for the following:    Neutrophils Relative % 82 (*)    All other components within normal limits  BASIC METABOLIC PANEL  CK  URINE MICROSCOPIC-ADD ON    Imaging  Review No results found. I have personally reviewed and evaluated these images and lab results as part of my medical decision-making.   EKG Interpretation None      MDM   Final diagnoses:  Chronic pain    This is essentially chronic pain. Normal enzymes and electrolytes. No new medications no acute processes. We discussed nighttime dose of Neurontin and chronic pain management through her primary care physician.    Tanna Furry, MD 08/20/15 [phone]

## 2015-08-20 NOTE — Discharge Instructions (Signed)
Take a dose of Neurontin starting tomorrow night at bedtime. Talk to your doctor about the dosage and continuing the medicine over the next 1 week  Chronic Pain Chronic pain can be defined as pain that is off and on and lasts for 3-6 months or longer. Many things cause chronic pain, which can make it difficult to make a diagnosis. There are many treatment options available for chronic pain. However, finding a treatment that works well for you may require trying various approaches until the right one is found. Many people benefit from a combination of two or more types of treatment to control their pain. SYMPTOMS  Chronic pain can occur anywhere in the body and can range from mild to very severe. Some types of chronic pain include:  Headache.  Low back pain.  Cancer pain.  Arthritis pain.  Neurogenic pain. This is pain resulting from damage to nerves. People with chronic pain may also have other symptoms such as:  Depression.  Anger.  Insomnia.  Anxiety. DIAGNOSIS  Your health care provider will help diagnose your condition over time. In many cases, the initial focus will be on excluding possible conditions that could be causing the pain. Depending on your symptoms, your health care provider may order tests to diagnose your condition. Some of these tests may include:   Blood tests.   CT scan.   MRI.   X-rays.   Ultrasounds.   Nerve conduction studies.  You may need to see a specialist.  TREATMENT  Finding treatment that works well may take time. You may be referred to a pain specialist. He or she may prescribe medicine or therapies, such as:   Mindful meditation or yoga.  Shots (injections) of numbing or pain-relieving medicines into the spine or area of pain.  Local electrical stimulation.  Acupuncture.   Massage therapy.   Aroma, color, light, or sound therapy.   Biofeedback.   Working with a physical therapist to keep from getting stiff.    Regular, gentle exercise.   Cognitive or behavioral therapy.   Group support.  Sometimes, surgery may be recommended.  HOME CARE INSTRUCTIONS   Take all medicines as directed by your health care provider.   Lessen stress in your life by relaxing and doing things such as listening to calming music.   Exercise or be active as directed by your health care provider.   Eat a healthy diet and include things such as vegetables, fruits, fish, and lean meats in your diet.   Keep all follow-up appointments with your health care provider.   Attend a support group with others suffering from chronic pain. SEEK MEDICAL CARE IF:   Your pain gets worse.   You develop a new pain that was not there before.   You cannot tolerate medicines given to you by your health care provider.   You have new symptoms since your last visit with your health care provider.  SEEK IMMEDIATE MEDICAL CARE IF:   You feel weak.   You have decreased sensation or numbness.   You lose control of bowel or bladder function.   Your pain suddenly gets much worse.   You develop shaking.  You develop chills.  You develop confusion.  You develop chest pain.  You develop shortness of breath.  MAKE SURE YOU:  Understand these instructions.  Will watch your condition.  Will get help right away if you are not doing well or get worse. Document Released: 08/18/2002 Document Revised: 07/29/2013 Document Reviewed:  05/22/2013 ExitCare Patient Information 2015 [place], [place]. This information is not intended to replace advice given to you by your health care provider. Make sure you discuss any questions you have with your health care provider.

## 2015-08-20 NOTE — ED Notes (Addendum)
Pt was discharged at 1509, pain was a 10 at that time.

## 2015-08-20 NOTE — ED Notes (Signed)
Pt complaining of aching all over, worse in legs, pt having to increase medication for pain, Pcp called in a cream for pt to use. Pt not getting any sleep

## 2015-12-11 DIAGNOSIS — J189 Pneumonia, unspecified organism: Secondary | ICD-10-CM

## 2015-12-11 HISTORY — DX: Pneumonia, unspecified organism: J18.9

## 2016-01-06 ENCOUNTER — Encounter (HOSPITAL_COMMUNITY): Payer: Self-pay | Admitting: Family Medicine

## 2016-01-06 ENCOUNTER — Inpatient Hospital Stay (HOSPITAL_COMMUNITY)
Admission: AD | Admit: 2016-01-06 | Discharge: 2016-01-23 | DRG: 308 | Disposition: A | Discharge status: Skilled Nursing Facility | Payer: Medicare Other | Source: Other Acute Inpatient Hospital | Attending: Internal Medicine | Admitting: Internal Medicine

## 2016-01-06 ENCOUNTER — Telehealth: Payer: Self-pay | Admitting: Internal Medicine

## 2016-01-06 DIAGNOSIS — I447 Left bundle-branch block, unspecified: Secondary | ICD-10-CM | POA: Diagnosis present

## 2016-01-06 DIAGNOSIS — Z85038 Personal history of other malignant neoplasm of large intestine: Secondary | ICD-10-CM

## 2016-01-06 DIAGNOSIS — I340 Nonrheumatic mitral (valve) insufficiency: Secondary | ICD-10-CM | POA: Diagnosis not present

## 2016-01-06 DIAGNOSIS — T502X5A Adverse effect of carbonic-anhydrase inhibitors, benzothiadiazides and other diuretics, initial encounter: Secondary | ICD-10-CM | POA: Diagnosis not present

## 2016-01-06 DIAGNOSIS — E876 Hypokalemia: Secondary | ICD-10-CM | POA: Diagnosis not present

## 2016-01-06 DIAGNOSIS — E871 Hypo-osmolality and hyponatremia: Secondary | ICD-10-CM | POA: Diagnosis present

## 2016-01-06 DIAGNOSIS — I5032 Chronic diastolic (congestive) heart failure: Secondary | ICD-10-CM | POA: Diagnosis present

## 2016-01-06 DIAGNOSIS — Z79899 Other long term (current) drug therapy: Secondary | ICD-10-CM | POA: Diagnosis not present

## 2016-01-06 DIAGNOSIS — Z66 Do not resuscitate: Secondary | ICD-10-CM | POA: Diagnosis present

## 2016-01-06 DIAGNOSIS — Z880 Allergy status to penicillin: Secondary | ICD-10-CM | POA: Diagnosis not present

## 2016-01-06 DIAGNOSIS — R0600 Dyspnea, unspecified: Secondary | ICD-10-CM | POA: Diagnosis not present

## 2016-01-06 DIAGNOSIS — I4891 Unspecified atrial fibrillation: Principal | ICD-10-CM | POA: Diagnosis present

## 2016-01-06 DIAGNOSIS — K219 Gastro-esophageal reflux disease without esophagitis: Secondary | ICD-10-CM | POA: Diagnosis present

## 2016-01-06 DIAGNOSIS — B952 Enterococcus as the cause of diseases classified elsewhere: Secondary | ICD-10-CM | POA: Diagnosis present

## 2016-01-06 DIAGNOSIS — N179 Acute kidney failure, unspecified: Secondary | ICD-10-CM | POA: Diagnosis present

## 2016-01-06 DIAGNOSIS — K649 Unspecified hemorrhoids: Secondary | ICD-10-CM | POA: Diagnosis present

## 2016-01-06 DIAGNOSIS — I10 Essential (primary) hypertension: Secondary | ICD-10-CM | POA: Diagnosis not present

## 2016-01-06 DIAGNOSIS — Z882 Allergy status to sulfonamides status: Secondary | ICD-10-CM

## 2016-01-06 DIAGNOSIS — I5043 Acute on chronic combined systolic (congestive) and diastolic (congestive) heart failure: Secondary | ICD-10-CM | POA: Diagnosis present

## 2016-01-06 DIAGNOSIS — I255 Ischemic cardiomyopathy: Secondary | ICD-10-CM | POA: Diagnosis present

## 2016-01-06 DIAGNOSIS — F419 Anxiety disorder, unspecified: Secondary | ICD-10-CM | POA: Diagnosis present

## 2016-01-06 DIAGNOSIS — K5900 Constipation, unspecified: Secondary | ICD-10-CM | POA: Diagnosis present

## 2016-01-06 DIAGNOSIS — R197 Diarrhea, unspecified: Secondary | ICD-10-CM | POA: Diagnosis not present

## 2016-01-06 DIAGNOSIS — M810 Age-related osteoporosis without current pathological fracture: Secondary | ICD-10-CM | POA: Diagnosis present

## 2016-01-06 DIAGNOSIS — I2510 Atherosclerotic heart disease of native coronary artery without angina pectoris: Secondary | ICD-10-CM | POA: Diagnosis present

## 2016-01-06 DIAGNOSIS — D72829 Elevated white blood cell count, unspecified: Secondary | ICD-10-CM | POA: Diagnosis present

## 2016-01-06 DIAGNOSIS — I110 Hypertensive heart disease with heart failure: Secondary | ICD-10-CM | POA: Diagnosis present

## 2016-01-06 DIAGNOSIS — N390 Urinary tract infection, site not specified: Secondary | ICD-10-CM | POA: Diagnosis present

## 2016-01-06 DIAGNOSIS — R0602 Shortness of breath: Secondary | ICD-10-CM

## 2016-01-06 DIAGNOSIS — I509 Heart failure, unspecified: Secondary | ICD-10-CM

## 2016-01-06 DIAGNOSIS — J90 Pleural effusion, not elsewhere classified: Secondary | ICD-10-CM | POA: Diagnosis not present

## 2016-01-06 DIAGNOSIS — Z9889 Other specified postprocedural states: Secondary | ICD-10-CM

## 2016-01-06 HISTORY — DX: Unspecified osteoarthritis, unspecified site: M19.90

## 2016-01-06 HISTORY — DX: Gastro-esophageal reflux disease without esophagitis: K21.9

## 2016-01-06 HISTORY — DX: Pneumonia, unspecified organism: J18.9

## 2016-01-06 HISTORY — DX: Anxiety disorder, unspecified: F41.9

## 2016-01-06 MED ORDER — PANTOPRAZOLE SODIUM 40 MG PO TBEC
40.0000 mg | DELAYED_RELEASE_TABLET | Freq: Every day | ORAL | Status: DC
  Administered 2016-01-07 – 2016-01-23 (×17): 40 mg via ORAL
  Filled 2016-01-06 (×17): qty 1

## 2016-01-06 MED ORDER — ACETAMINOPHEN 650 MG RE SUPP: 650.0000 mg | Freq: Four times a day (QID) | RECTAL | Status: DC | PRN

## 2016-01-06 MED ORDER — DILTIAZEM LOAD VIA INFUSION
10.0000 mg | Freq: Once | INTRAVENOUS | Status: AC
  Administered 2016-01-07: 10 mg via INTRAVENOUS
  Filled 2016-01-06: qty 10

## 2016-01-06 MED ORDER — SENNOSIDES-DOCUSATE SODIUM 8.6-50 MG PO TABS
1.0000 | ORAL_TABLET | Freq: Every evening | ORAL | Status: DC | PRN
  Filled 2016-01-06: qty 1

## 2016-01-06 MED ORDER — LORAZEPAM 0.5 MG PO TABS
0.5000 mg | ORAL_TABLET | Freq: Three times a day (TID) | ORAL | Status: DC | PRN
  Administered 2016-01-07 – 2016-01-09 (×4): 1 mg via ORAL
  Filled 2016-01-06 (×5): qty 2

## 2016-01-06 MED ORDER — DILTIAZEM HCL 100 MG IV SOLR
5.0000 mg/h | INTRAVENOUS | Status: DC
  Administered 2016-01-07 (×2): 5 mg/h via INTRAVENOUS
  Administered 2016-01-08: 15 mg/h via INTRAVENOUS
  Filled 2016-01-06 (×6): qty 100

## 2016-01-06 MED ORDER — POLYETHYLENE GLYCOL 3350 17 G PO PACK
17.0000 g | PACK | Freq: Every day | ORAL | Status: DC | PRN
  Administered 2016-01-07: 17 g via ORAL
  Filled 2016-01-06: qty 1

## 2016-01-06 MED ORDER — ACETAMINOPHEN 325 MG PO TABS
650.0000 mg | ORAL_TABLET | Freq: Four times a day (QID) | ORAL | Status: DC | PRN
  Administered 2016-01-13 – 2016-01-22 (×3): 650 mg via ORAL
  Filled 2016-01-06 (×3): qty 2

## 2016-01-06 NOTE — Progress Notes (Addendum)
ANTICOAGULATION CONSULT NOTE - Initial Consult  Pharmacy Consult for Coumadin Indication: atrial fibrillation  Allergies  Allergen Reactions  . Penicillins   . Sulfa Drugs Cross Reactors Hives    Patient Measurements: Height: 5' (152.4 cm) Weight: 124 lb 8 oz (56.473 kg) IBW/kg (Calculated) : 45.5  Vital Signs: Temp: 97.4 F (36.3 C) (01/27 2222) Temp Source: Oral (01/27 2222) BP: 103/64 mmHg (01/27 2222) Pulse Rate: 119 (01/27 2222)   Medical History: Past Medical History  Diagnosis Date  . Arthritis   . Osteoporosis   . Cancer ([place])     colon  . Hypertension   . Colon cancer [facility])     1980    Assessment: 82yo female tx'd from [facility] by pt request for new-onset Afib, to start Coumadin; CHADS2VASc 5 now that pt also has new CHF.  No INR drawn at [facility].  Pt states she has never taken Coumadin.  Goal of Therapy:  INR 2-3   Plan:  Will give Coumadin 5mg  po x1 and monitor INR for dose adjustments.  Will start Coumadin education.  [name] [name], PharmD, BCPS  01/06/2016,11:37 PM

## 2016-01-06 NOTE — H&P (Signed)
History and Physical  Patient Name: [name] [name]     [id number]    DOB: 08-Mar-1932    DOA: 01/06/2016 Referring physician: [facility], Dr. Woody Seller PCP: Glenda Chroman., MD      Chief Complaint: Shortness of breath  HPI: [name] [name] is a 82 y.o. female with a past medical history significant for HTN and chronic systolic and diastolic CHF, but last EF 55% who presents with SOB and afib.  The patient was in her usual state of health until the beginning of this month when she developed dyspnea and cough, was observed overnight at [facility], treated for pneumonia and discharged.  A week later, she developed chest discomfort and weakness, checked her HR on her BP cuff, found it to be elevated, went back to the hospital and was diagnosed with new onset Afib.  She reports being "on a drip in the ICU" for five days, then converted to an oral agent (she thinks for HR control), seen by a Cardiologist who discussed cardioversion with her, and then being discharged to a rehab facility about a week ago.  Now, over the last week, she has had progressive weakness and shortness of breath when working with PT at rehabilitation. At night she also describes spells of vague chest discomfort that are intermittent and resolve with lorazepam. She has not had leg swelling, orthopnea, paroxysmal nocturnal dyspnea, frank chest pain, palpitations, or fever, chills, cough, dyspnea at rest, rigors, or sputum.  She was admitted to [facility] last night after a CT angiogram and chest x-ray suggested pulmonary edema without PE, and she was thought to have acute diastolic CHF. She ruled out with serial troponins overnight.  Today, the patient's family requested transfer to [facility] because they were hoping to see new doctors.  On arrival, the patient was comfortable but in atrial fibrillation with rate 110-150 bpm.       Review of Systems:  Pt complains of vague chest discomfort, weakness,  SOB with exertion. All other systems negative except as just noted or noted in the history of present illness.  Allergies  Allergen Reactions  . Penicillins   . Sulfa Drugs Cross Reactors Hives    Prior to Admission medications   Medication Sig Start Date End Date Taking? Authorizing Provider  bisoprolol-hydrochlorothiazide (ZIAC) 2.5-6.25 MG per tablet Take 2 tablets by mouth daily.    Historical Provider, MD  calcium-vitamin D (OSCAL WITH D) 500-200 MG-UNIT per tablet Take 1 tablet by mouth daily.    Historical Provider, MD  Cholecalciferol (VITAMIN D-3 PO) Take 2,000 mg by mouth daily.    Historical Provider, MD  ciprofloxacin (CIPRO) 500 MG tablet Take 500 mg by mouth 2 (two) times daily.    Historical Provider, MD  gabapentin (NEURONTIN) 300 MG capsule Take 1 capsule (300 mg total) by mouth at bedtime. 08/20/15   Tanna Furry, MD  HYDROcodone-acetaminophen (NORCO) 7.5-325 MG per tablet Take 1 tablet by mouth every 12 (twelve) hours.    Historical Provider, MD  lisinopril (PRINIVIL,ZESTRIL) 10 MG tablet Take 10 mg by mouth 2 (two) times daily.    Historical Provider, MD  LORazepam (ATIVAN) 1 MG tablet Take 0.5-1 mg by mouth every 8 (eight) hours as needed for anxiety.     Historical Provider, MD  polyethylene glycol (MIRALAX / GLYCOLAX) packet Take 17 g by mouth daily. One tablespoon a day    Historical Provider, MD  vitamin E (VITAMIN E) 400 UNIT capsule Take 400 Units by mouth daily.  Historical Provider, MD    Past Medical History  Diagnosis Date  . Arthritis   . Osteoporosis   . Cancer ([place])     colon  . Hypertension   . Colon cancer ([place])     1980    Past Surgical History  Procedure Laterality Date  . Cholecystectomy    . Colon surgery  1980  . Rectal surgery  1998  . Cataract extraction  2010  . Knee surgery  1999  . Hand surgery  1983    Family history: family history includes Congestive Heart Failure in her mother; Hypertension in her father; Prostate cancer in  her father.  Social History: Patient lives alone.  She drives short distances, and is independent with all ADLs and IADLs, and walks without an assistive device. She has been in rehabilitation for the last week. She is not a smoker and does not drink.        Physical Exam: BP 103/64 mmHg  Pulse 119  Temp(Src) 97.4 F (36.3 C) (Oral)  Resp 18  Ht 5' (1.524 m)  Wt 56.473 kg (124 lb 8 oz)  BMI 24.31 kg/m2  SpO2 95% General appearance: Thin elderly  female, alert and in no acute distress.   Eyes: Anicteric, conjunctiva pink, lids and lashes normal.     ENT: No nasal deformity, discharge, or epistaxis.  OP moist without lesions.   Lymph: No cervical or supraclavicular lymphadenopathy. Skin: Warm and dry.   Cardiac: Irregularly irregular, tachycardic, nl S1-S2, no murmurs appreciated.  Capillary refill is brisk.  JVP normal.  No LE edema.  Radial and DP pulses 2+ and symmetric. Respiratory: Normal respiratory rate and rhythm.  CTAB without rales or wheezes.  Minimal bibasilar crackles. Abdomen: Abdomen soft without rigidity.  No TTP. No ascites, distension.   MSK: No deformities or effusions. Neuro: Cranial nerves intact.  Sensorium intact and responding to questions, attention normal.  Speech is fluent.  Moves all extremities equally and with normal coordination.    Psych: Behavior appropriate.  Affect normal.  No evidence of aural or visual hallucinations or delusions.       Labs on Admission at OSH:  The metabolic panel shows hyponatremia, mild, normal potassium, renal function. The alkaline phosphatase is minimally elevated, other LFTs normal. The NT pro-BNP is 2762. The TNI was negative x3 overnight at [facility]. The d-dimer was within the age adjusted limit. The complete blood count shows leukocytosis with WBC 13.9K/uL, no anemia or thrombocytopenia.   Radiological Exams on Admission: Personally reviewed: CXR on 01/06/2016 at [facility] is reviewed from [place].   It is reported as  "pulmonary edema", and I note the opacities to be somewhat asymmetric, R base > L.     CT angiogram chest [facility] 01/06/2016  No PE Small bilateral pleural effusions Increased interstitial markings, consistent with pulmonary edema.     EKG: Independently reviewed. LBBB with Afib, rate 109.  QTc 529, slightly elevated.    Assessment/Plan 1. Afib with RVR:  This is new.  The patient has no history of Afib until earlier this month.  CHADs2Vasc 5 (CHF, HTN, Age, sex).  She claims she was on a drip for 5 days and then converted to an oral agent for HR control, but the only nodal blocker I see she is on at admission to [facility] is bisoprolol 2.5 mg.    -Diltiazem 10 mg then infusion, titrate to HR 65-105 -Hold bispoprolol/HCTZ and ACEi for now given soft BP at admission while on  diltiazem -Start warfarin -Consult to CM re: coverage of TSOAC -Consult to Cardiology, appreciate cares -Telemetry   2. Chronic diastolic CHF:  Old history of ischemic CM.  Recent echo with EF 55%.  I find the patient to be euvolemic on exam, despite CXR, and so: -I will defer diuresis tonight. -Hold ACEi and BB as above -Strict I/O, daily weights  3. HTN and CAD:  Remote history in Dr. Tennis Must Gent's notes of abnormal stress, decreased LVF, but recent echo with pEF.  Not on statin. -Diltiazem as above -Restart ACEi and BB/HCTZ as needed, if BP tolerates diltiazem gtt  4. Hyponatremia:  In setting of Afib and resulting CHF.  5. Leukocytosis:  Unclear etiology.  Do not suspect recurrent/worsening pneumonia at admission. -Trend CBC -Blood cx and start abx if temp > 101F  6. Anxiety:  -May continue lorazepam PRN      DVT PPx: Warfarin Diet: Cardiac Consultants: Cardiology Code Status: DO NOT RESUSCITATE Family Communication: Daughter and son-in-law present at bedside. All questions answered.  CODE STATUS confirmed.  Overnight plan for diltiazem discussed, warfarin, Cardiology consult.    Medical decision making: What exists of the patient's previous chart and records from [facility] was reviewed in depth and the case was discussed with Dr. Sheran Fava. Patient seen 11:19 PM on 01/06/2016.  Disposition Plan:  I recommend admission to telemetry.  Clinical condition: stable.  Anticipate titration of CCB for 2-3 days, consutlation with Cardiology.  Discharge within 3-4 days.      Edwin Dada [facility] Pager [phone]

## 2016-01-06 NOTE — Telephone Encounter (Signed)
Call from [facility].  Patient's family requesting transfer to [facility] for management of diastolic heart failure.  Per MD, patient in stable a-fib without concern for ACS, however, family adamant about transfer.  Told MD, we would accept patient to telemetry bed, however, to please notify family that insurance will often not cover transport costs if there is not a medical need for transfer.

## 2016-01-07 ENCOUNTER — Encounter (HOSPITAL_COMMUNITY): Payer: Self-pay | Admitting: General Practice

## 2016-01-07 DIAGNOSIS — I5043 Acute on chronic combined systolic (congestive) and diastolic (congestive) heart failure: Secondary | ICD-10-CM

## 2016-01-07 DIAGNOSIS — I4891 Unspecified atrial fibrillation: Principal | ICD-10-CM

## 2016-01-07 DIAGNOSIS — I10 Essential (primary) hypertension: Secondary | ICD-10-CM

## 2016-01-07 LAB — PROTIME-INR
INR: 1.08 (ref 0.00–1.49)
Prothrombin Time: 14.2 seconds (ref 11.6–15.2)

## 2016-01-07 LAB — MRSA PCR SCREENING: MRSA by PCR: NEGATIVE

## 2016-01-07 LAB — BASIC METABOLIC PANEL
Anion gap: 9 (ref 5–15)
BUN: 14 mg/dL (ref 6–20)
CALCIUM: 9.1 mg/dL (ref 8.9–10.3)
CO2: 31 mmol/L (ref 22–32)
CREATININE: 0.67 mg/dL (ref 0.44–1.00)
Chloride: 98 mmol/L — ABNORMAL LOW (ref 101–111)
GFR calc Af Amer: >60 mL/min (ref >60)
GFR calc non Af Amer: >60 mL/min (ref >60)
GLUCOSE: 94 mg/dL (ref 65–99)
Potassium: 3.3 mmol/L — ABNORMAL LOW (ref 3.5–5.1)
Sodium: 138 mmol/L (ref 135–145)

## 2016-01-07 LAB — CBC
HCT: 38.4 % (ref 36.0–46.0)
Hemoglobin: 13 g/dL (ref 12.0–15.0)
MCH: 32 pg (ref 26.0–34.0)
MCHC: 33.9 g/dL (ref 30.0–36.0)
MCV: 94.6 fL (ref 78.0–100.0)
PLATELETS: 324 10*3/uL (ref 150–400)
RBC: 4.06 MIL/uL (ref 3.87–5.11)
RDW: 14.7 % (ref 11.5–15.5)
WBC: 8.6 10*3/uL (ref 4.0–10.5)

## 2016-01-07 LAB — MAGNESIUM: MAGNESIUM: 2.3 mg/dL (ref 1.7–2.4)

## 2016-01-07 LAB — TSH: TSH: 0.906 u[IU]/mL (ref 0.350–4.500)

## 2016-01-07 LAB — TROPONIN I

## 2016-01-07 LAB — HEPARIN LEVEL (UNFRACTIONATED): HEPARIN UNFRACTIONATED: 0.38 [IU]/mL (ref 0.30–0.70)

## 2016-01-07 MED ORDER — WARFARIN VIDEO
Freq: Once | Status: DC
Start: 2016-01-07 — End: 2016-01-07

## 2016-01-07 MED ORDER — FUROSEMIDE 10 MG/ML IJ SOLN
40.0000 mg | Freq: Every day | INTRAMUSCULAR | Status: DC
  Administered 2016-01-07 – 2016-01-08 (×2): 40 mg via INTRAVENOUS
  Filled 2016-01-07 (×2): qty 4

## 2016-01-07 MED ORDER — WARFARIN SODIUM 5 MG PO TABS: 5.0000 mg | ORAL_TABLET | Freq: Once | ORAL | Status: DC

## 2016-01-07 MED ORDER — WARFARIN - PHARMACIST DOSING INPATIENT: Freq: Every day | Status: DC

## 2016-01-07 MED ORDER — HEPARIN (PORCINE) IN NACL 100-0.45 UNIT/ML-% IJ SOLN
800.0000 [IU]/h | INTRAMUSCULAR | Status: AC
  Administered 2016-01-07 – 2016-01-11 (×3): 800 [IU]/h via INTRAVENOUS
  Filled 2016-01-07 (×4): qty 250

## 2016-01-07 MED ORDER — HEPARIN BOLUS VIA INFUSION
3000.0000 [IU] | Freq: Once | INTRAVENOUS | Status: AC
  Administered 2016-01-07: 3000 [IU] via INTRAVENOUS
  Filled 2016-01-07: qty 3000

## 2016-01-07 MED ORDER — POTASSIUM CHLORIDE CRYS ER 20 MEQ PO TBCR
40.0000 meq | EXTENDED_RELEASE_TABLET | Freq: Once | ORAL | Status: AC
  Administered 2016-01-07: 40 meq via ORAL
  Filled 2016-01-07 (×2): qty 2

## 2016-01-07 MED ORDER — COUMADIN BOOK
Freq: Once | Status: AC
  Administered 2016-01-07: 14:00:00
  Filled 2016-01-07: qty 1

## 2016-01-07 MED ORDER — POTASSIUM CHLORIDE CRYS ER 10 MEQ PO TBCR
EXTENDED_RELEASE_TABLET | ORAL | Status: AC
  Administered 2016-01-07: 14:00:00
  Filled 2016-01-07: qty 4

## 2016-01-07 NOTE — Consult Note (Signed)
Patient ID: [name] [name] MRN: [id number], DOB/AGE: [birth year]   Admit date: 01/06/2016  Primary Physician: Glenda Chroman., MD Primary Cardiologist: [facility] (seen by Dr. Dannielle Burn in 2008)  Pt. Profile:  82 y/o female with h/o chronic systolic + diastolic CHF, chronic LBBB and HTN admitted for new onset atrial fibrillation.   Problem List  Past Medical History  Diagnosis Date  . Osteoporosis   . Hypertension   . Colon cancer ([place])     1980  . Pneumonia 12/2015  . GERD (gastroesophageal reflux disease)   . Osteoarthritis     "all over"  . Anxiety     Past Surgical History  Procedure Laterality Date  . Colectomy  1980    "cancer"  . Rectal surgery  1998    "enlarged rectum; cut out some hemorrhoids"  . Cataract extraction w/ intraocular lens  implant, bilateral Bilateral 2010  . Knee arthroscopy Right 1999  . Hand nerve repair Right 1983    "fell on a bowl and hit right on my wrist; cut nerves/tendons"  . Tonsillectomy  1943  . Laparoscopic cholecystectomy  early 2000s  . Vaginal hysterectomy  1966?     Allergies  Allergies  Allergen Reactions  . Penicillins   . Sulfa Drugs Cross Reactors Hives    HPI  82 y/o female with h/o chronic systolic + diastolic CHF, chronic LBBB and HTN admitted for new onset atrial fibrillation. She was last seen by Dr. Dannielle Burn, from [facility], in 2008. EF at that time was 40%. Stress echo was negative for ischemia, thus no cath was performed. It appears she was lost to f/u.   The patient initially presented to [facility] several weeks ago with a complaint of shortness of breath and a cough. She was treated for PNA, monitored overnight and discharged home. She presented back 1 week later with chest discomfort and weakness + elevated HR. She was found to be in new onset atrial fibrillation. She was treated at [facility] with an IV drip and was seen by a cardiologist at the time, who discussed  cardioversion with her. She was discharged to a rehab facility with plans to f/u with cardiology at [facility]. Per records in [place], a consult was placed but no visit since discharge.   Over the past week, she has had worsening symptoms of weakness and dyspnea on exertion, prompting her to report back to [facility]. CT angiogram and chest x-ray suggested pulmonary edema without PE, and she was thought to have acute diastolic CHF. Cardiac enzymes were negative. The patient's family requested transfer to [facility] for further care.    On arrival to [facility], she remained in atrial fibrillation with a pulse rate in the low 100s -110s. BP is stable. TSH normal as well as Mg. K is low today at 3.3. She is afebrile. She denies any CP or dyspnea currently.    Home Medications  Prior to Admission medications   Medication Sig Start Date End Date Taking? Authorizing Provider  omeprazole (PRILOSEC) 20 MG capsule Take 20 mg by mouth daily.   Yes Historical Provider, MD  bisoprolol-hydrochlorothiazide (ZIAC) 2.5-6.25 MG per tablet Take 2 tablets by mouth daily.    Historical Provider, MD  calcium-vitamin D (OSCAL WITH D) 500-200 MG-UNIT per tablet Take 1 tablet by mouth daily.    Historical Provider, MD  Cholecalciferol (VITAMIN D-3 PO) Take 2,000 mg by mouth daily.    Historical Provider, MD  lisinopril (PRINIVIL,ZESTRIL) 10 MG  tablet Take 10 mg by mouth 2 (two) times daily.    Historical Provider, MD  LORazepam (ATIVAN) 1 MG tablet Take 0.5-1 mg by mouth every 8 (eight) hours as needed for anxiety.     Historical Provider, MD  polyethylene glycol (MIRALAX / GLYCOLAX) packet Take 17 g by mouth daily. One tablespoon a day    Historical Provider, MD  vitamin E (VITAMIN E) 400 UNIT capsule Take 400 Units by mouth daily.    Historical Provider, MD    Family History  Family History  Problem Relation Age of Onset  . Congestive Heart Failure Mother   . Prostate cancer Father   . Hypertension Father      Social History  Social History   Social History  . Marital Status: Divorced    Spouse Name: N/A  . Number of Children: N/A  . Years of Education: N/A   Occupational History  . Not on file.   Social History Main Topics  . Smoking status: Never Smoker   . Smokeless tobacco: Never Used  . Alcohol Use: No  . Drug Use: No  . Sexual Activity: No   Other Topics Concern  . Not on file   Social History Narrative     Review of Systems General:  No chills, fever, night sweats or weight changes.  Cardiovascular:  No chest pain, dyspnea on exertion, edema, orthopnea, palpitations, paroxysmal nocturnal dyspnea. Dermatological: No rash, lesions/masses Respiratory: No cough, dyspnea Urologic: No hematuria, dysuria Abdominal:   No nausea, vomiting, diarrhea, bright red blood per rectum, melena, or hematemesis Neurologic:  No visual changes, wkns, changes in mental status. All other systems reviewed and are otherwise negative except as noted above.  Physical Exam  Blood pressure 127/61, pulse 96, temperature 97.5 F (36.4 C), temperature source Oral, resp. rate 18, height 5' (1.524 m), weight 123 lb 9.6 oz (56.065 kg), SpO2 95 %.  General: Pleasant, NAD Psych: Normal affect. Neuro: Alert and oriented X 3. Moves all extremities spontaneously. HEENT: Normal  Neck: Supple without bruits, + JVD. Lungs:  Resp regular and unlabored, CTA. Heart: irreg.irregular no s3, s4, or murmurs. Abdomen: Soft, non-tender, non-distended, BS + x 4.  Extremities: No clubbing, cyanosis or edema. DP/PT/Radials 2+ and equal bilaterally.  Labs  Troponin (Point of Care Test) No results for input(s): TROPIPOC in the last 72 hours. No results for input(s): CKTOTAL, CKMB, TROPONINI in the last 72 hours. Lab Results  Component Value Date   WBC 8.6 01/07/2016   HGB 13.0 01/07/2016   HCT 38.4 01/07/2016   MCV 94.6 01/07/2016   PLT 324 01/07/2016    Recent Labs Lab 01/07/16 0320  NA 138  K 3.3*   CL 98*  CO2 31  BUN 14  CREATININE 0.67  CALCIUM 9.1  GLUCOSE 94   ECG  Atrial fibrillation w/ RVR   ASSESSMENT AND PLAN  Principal Problem:   Atrial fibrillation with RVR (HCC) Active Problems:   Chronic diastolic CHF (congestive heart failure) (HCC)   Hyponatremia   Leukocytosis   Essential hypertension   Atherosclerosis of native coronary artery of native heart without angina pectoris  1. Atrial Fibrillation: patient remains in atrial fibrillation. TSH and Mg level both normal. K is low but supplemental K has been ordered. HR is poorly controlled in the 110s resting. She is symptomatic. Continue IV Cardizem for rate control. IV heparin has been ordered. We will obtain a 2D echo to assess LVF. We will also plan for TEE/DCCV on Monday  if still in atrial fibrillation. She will need oral anticoagulation post cardioversion. Renal function is normal. We will need to consider DOAC vs Coumadin, depending on cost, as patient is on Medicare.   2. Hypokalemia: K is 3.3 today. 40 mEq of K-dur ordered. F/u BMP in the am.   3. Acute on Chronic Combined Systolic + Diastolic CHF: prior assessment of systolic function in [id number] showed EF to be 40%. Stress echo negative for ischemia. She is volume overloaded. Continue IV Lasix. Monitor I/Os and daily weights.   Signed, Lyda Jester, PA-C 01/07/2016, 11:52 AM  The patient was seen, examined and discussed with Brittainy M. Rosita Fire, PA-C and I agree with the above.   82 y/o female with h/o chronic systolic + diastolic CHF, chronic LBBB and HTN admitted for new onset atrial fibrillation.  She was last seen by Dr. Dannielle Burn, from [facility], in 2008. EF at that time was 40%. Stress echo was negative for ischemia, thus no cath was performed. It appears she was lost to f/u. She has been hospitalized 3x in the last 2 weeks and was diagnosed with a new onset atrial fibrillation with RVR and combined acute on hronic systolic and diastolic CHF.  . Currently on iv Cardizem with persistent ventricular tachycardia.   I think that her CHF are sec to a-fib with RVR, its difficult to control her HR with bisoprolol and iv Cardizem. Her CHADS-VASc is 5, I will start heparin drip. She is mildly fluid overloaded, we will order a low dose lasix 20 mg iv bid, crea is normal. If she doesn't cardiovert till Monday, we will plan for a TEE/DCCV and long tern anticoagulation with NOAC. Hb is normal.   Dorothy Spark 01/07/2016

## 2016-01-07 NOTE — Progress Notes (Signed)
PROGRESS NOTE  [name] [name] [id number] DOB: 04-24-1932 DOA: 01/06/2016 PCP: Glenda Chroman., MD   [name] [name] is a 82 y.o. female with a past medical history significant for HTN and chronic systolic and diastolic CHF, but last EF 55% who presents with SOB and afib.  The patient was in her usual state of health until the beginning of this month when she developed dyspnea and cough, was observed overnight at [facility], treated for pneumonia and discharged. A week later, she developed chest discomfort and weakness, checked her HR on her BP cuff, found it to be elevated, went back to the hospital and was diagnosed with new onset Afib. She reports being "on a drip in the ICU" for five days, then converted to an oral agent (she thinks for HR control), seen by a Cardiologist who discussed cardioversion with her, and then being discharged to a rehab facility about a week ago.  Now, over the last week, she has had progressive weakness and shortness of breath when working with PT at rehabilitation. At night she also describes spells of vague chest discomfort that are intermittent and resolve with lorazepam. She has not had leg swelling, orthopnea, paroxysmal nocturnal dyspnea, frank chest pain, palpitations, or fever, chills, cough, dyspnea at rest, rigors, or sputum.  She was admitted to [facility] last night after a CT angiogram and chest x-ray suggested pulmonary edema without PE, and she was thought to have acute diastolic CHF. She ruled out with serial troponins overnight. The patient's family requested transfer to [facility] because they were hoping to see new doctors.  REQUESTED RECORDS FROM [facility]  Assessment/Plan: Afib with RVR:   new? The patient has no history of Afib until earlier this month. CHADs2Vasc 5 (CHF, HTN, Age, sex). She claims she was on a drip for 5 days and then converted to an oral agent for HR control, but the only nodal blocker I see she is on  at admission to [facility] is bisoprolol 2.5 mg.  -Diltiazem 10 mg then infusion, titrate to HR 65-105- now at 5 -Hold bispoprolol/HCTZ and ACEi for now given soft BP at admission while on diltiazem -Consult to Cardiology, appreciate cares -heparin gtt- d/c coumadin-- does not appear to have been d/c'd on any anticoagulant  Chronic diastolic CHF:  Old history of ischemic CM. Recent echo with EF 55%. -IV Lasix 40 mg daily while we wait on record from [facility] -does not look overtly volume overloaded   HTN and CAD:  Remote history in Dr. Tennis Must Gent's notes of abnormal stress, decreased LVF, but recent echo with pEF. Not on statin. -Diltiazem gtt- transition over to PO  Hyponatremia:  In setting of Afib and resulting CHF.   Anxiety:  -May continue lorazepam PRN  Code Status: DNR Family Communication: patient Disposition Plan:    Consultants:    Procedures:    HPI/Subjective: Feeling better Anxiety she feels when heart is fast is gone  Objective: Filed Vitals:   01/07/16 0745 01/07/16 1000  BP: 105/50 127/61  Pulse: 89 96  Temp: 97.5 F (36.4 C)   Resp: 18 18    Intake/Output Summary (Last 24 hours) at 01/07/16 1122 Last data filed at 01/07/16 1041  Gross per 24 hour  Intake 509.17 ml  Output    975 ml  Net -465.83 ml   Filed Weights   01/06/16 2222 01/07/16 0330  Weight: 56.473 kg (124 lb 8 oz) 56.065 kg (123 lb 9.6 oz)    Exam:   General:  Awake, NAD  Cardiovascular: irr  Respiratory: clear  Abdomen: +BS, soft  Musculoskeletal: min edema   Data Reviewed: Basic Metabolic Panel:  Recent Labs Lab 01/07/16 0320  NA 138  K 3.3*  CL 98*  CO2 31  GLUCOSE 94  BUN 14  CREATININE 0.67  CALCIUM 9.1  MG 2.3   Liver Function Tests: No results for input(s): AST, ALT, ALKPHOS, BILITOT, PROT, ALBUMIN in the last 168 hours. No results for input(s): LIPASE, AMYLASE in the last 168 hours. No results for input(s): AMMONIA in the last 168  hours. CBC:  Recent Labs Lab 01/07/16 0320  WBC 8.6  HGB 13.0  HCT 38.4  MCV 94.6  PLT 324   Cardiac Enzymes: No results for input(s): CKTOTAL, CKMB, CKMBINDEX, TROPONINI in the last 168 hours. BNP (last 3 results) No results for input(s): BNP in the last 8760 hours.  ProBNP (last 3 results) No results for input(s): PROBNP in the last 8760 hours.  CBG: No results for input(s): GLUCAP in the last 168 hours.  Recent Results (from the past 240 hour(s))  MRSA PCR Screening     Status: None   Collection Time: 01/06/16 10:25 PM  Result Value Ref Range Status   MRSA by PCR NEGATIVE NEGATIVE Final    Comment:        The GeneXpert MRSA Assay (FDA approved for NASAL specimens only), is one component of a comprehensive MRSA colonization surveillance program. It is not intended to diagnose MRSA infection nor to guide or monitor treatment for MRSA infections.      Studies: No results found.  Scheduled Meds: . coumadin book   Does not apply Once  . pantoprazole  40 mg Oral Daily  . potassium chloride  40 mEq Oral Once  . warfarin  5 mg Oral ONCE-1800  . warfarin   Does not apply Once  . Warfarin - Pharmacist Dosing Inpatient   Does not apply q1800   Continuous Infusions: . diltiazem (CARDIZEM) infusion 5 mg/hr (01/07/16 0010)   Antibiotics Given (last 72 hours)    None      Principal Problem:   Atrial fibrillation with RVR (HCC) Active Problems:   Chronic diastolic CHF (congestive heart failure) (HCC)   Hyponatremia   Leukocytosis   Essential hypertension   Atherosclerosis of native coronary artery of native heart without angina pectoris    Time spent: 35 min    [facility] Pager [phone]. If 7PM-7AM, please contact night-coverage at [email or web address], password [facility] 01/07/2016, 11:22 AM  LOS: 1 day

## 2016-01-07 NOTE — Consult Note (Addendum)
ANTICOAGULATION CONSULT NOTE - Initial Consult  Pharmacy Consult for heparin Indication: atrial fibrillation  Patient Measurements: Height: 5' (152.4 cm) Weight: 123 lb 9.6 oz (56.065 kg) IBW/kg (Calculated) : 45.5 Heparin Dosing Weight: 56.5 kg  Labs:  Recent Labs  01/07/16 0320  HGB 13.0  HCT 38.4  PLT 324  LABPROT 14.2  INR 1.08  CREATININE 0.67    Estimated Creatinine Clearance: 43.3 mL/min (by C-G formula based on Cr of 0.67).   Assessment: 82yo female tx'd from [facility] by pt request for new-onset Afib. No anticoag PTA.   Goal of Therapy:  Heparin level 0.3-0.7 units/ml Monitor platelets by anticoagulation protocol: Yes   Plan:  Give 3000 units bolus x 1 Start heparin infusion at 800 units/hr Check anti-Xa level in 8 hours and daily while on heparin Continue to monitor H&H and platelets  Joya San, PharmD Clinical Pharmacy Resident Pager # [phone] 01/07/2016 11:40 AM   Addendum: Initial heparin level is therapeutic at 0.38. Continue heparin at 800 units/hr and follow up AM labs.   Nena Jordan, PharmD, BCPS 01/07/2016, 8:08 PM

## 2016-01-07 NOTE — Progress Notes (Signed)
   01/06/16 2222  Vitals  Temp 97.4 F (36.3 C)  Temp Source Oral  BP 103/64 mmHg  BP Location Right Arm  BP Method Automatic  Patient Position (if appropriate) Lying  Pulse Rate (!) 119  Pulse Rate Source Monitor  ECG Heart Rate (!) 110  Cardiac Rhythm Atrial fibrillation  Resp 18  Oxygen Therapy  SpO2 95 %  O2 Device Room Air  Height and Weight  Height 5' (1.524 m)  Weight 56.473 kg (124 lb 8 oz)  Type of Scale Used Standing (Scale B)  Type of Weight Actual  BSA (Calculated - sq m) 1.55 sq meters  BMI (Calculated) 24.4  Weight in (lb) to have BMI = 25 127.7  ECG Intervals  QRS interval 0.1  Admitted pt to rm 3E24 from [facility] hospital, pt alert and oriented, denied pain at this time, oriented to room, call bell placed within reach, MD on call notified.

## 2016-01-08 ENCOUNTER — Other Ambulatory Visit (HOSPITAL_COMMUNITY): Payer: Medicare Other

## 2016-01-08 DIAGNOSIS — I2510 Atherosclerotic heart disease of native coronary artery without angina pectoris: Secondary | ICD-10-CM

## 2016-01-08 DIAGNOSIS — I5043 Acute on chronic combined systolic (congestive) and diastolic (congestive) heart failure: Secondary | ICD-10-CM | POA: Insufficient documentation

## 2016-01-08 LAB — CBC
HCT: 40 % (ref 36.0–46.0)
HEMOGLOBIN: 13 g/dL (ref 12.0–15.0)
MCH: 31 pg (ref 26.0–34.0)
MCHC: 32.5 g/dL (ref 30.0–36.0)
MCV: 95.5 fL (ref 78.0–100.0)
Platelets: 331 10*3/uL (ref 150–400)
RBC: 4.19 MIL/uL (ref 3.87–5.11)
RDW: 14.9 % (ref 11.5–15.5)
WBC: 9.5 10*3/uL (ref 4.0–10.5)

## 2016-01-08 LAB — BASIC METABOLIC PANEL
ANION GAP: 8 (ref 5–15)
BUN: 10 mg/dL (ref 6–20)
CO2: 26 mmol/L (ref 22–32)
Calcium: 8.7 mg/dL — ABNORMAL LOW (ref 8.9–10.3)
Chloride: 102 mmol/L (ref 101–111)
Creatinine, Ser: 0.56 mg/dL (ref 0.44–1.00)
GFR calc Af Amer: >60 mL/min (ref >60)
GLUCOSE: 107 mg/dL — AB (ref 65–99)
POTASSIUM: 3.3 mmol/L — AB (ref 3.5–5.1)
Sodium: 136 mmol/L (ref 135–145)

## 2016-01-08 LAB — HEPARIN LEVEL (UNFRACTIONATED)
HEPARIN UNFRACTIONATED: 0.46 [IU]/mL (ref 0.30–0.70)
Heparin Unfractionated: 0.46 IU/mL (ref 0.30–0.70)

## 2016-01-08 LAB — TROPONIN I: Troponin I: <0.03 ng/mL (ref <0.031)

## 2016-01-08 MED ORDER — POTASSIUM CHLORIDE CRYS ER 20 MEQ PO TBCR
40.0000 meq | EXTENDED_RELEASE_TABLET | Freq: Once | ORAL | Status: AC
  Administered 2016-01-08: 40 meq via ORAL

## 2016-01-08 NOTE — Progress Notes (Signed)
PROGRESS NOTE  [name] [name] [id number] DOB: 12/23/1931 DOA: 01/06/2016 PCP: Glenda Chroman., MD   [name] [name] is a 82 y.o. female with a past medical history significant for HTN and chronic systolic and diastolic CHF, but last EF 55% who presents with SOB and afib.  The patient was in her usual state of health until the beginning of this month when she developed dyspnea and cough, was observed overnight at [facility], treated for pneumonia and discharged. A week later, she developed chest discomfort and weakness, checked her HR on her BP cuff, found it to be elevated, went back to the hospital and was diagnosed with new onset Afib. She reports being "on a drip in the ICU" for five days, then converted to an oral agent (she thinks for HR control), seen by a Cardiologist who discussed cardioversion with her, and then being discharged to a rehab facility about a week ago.  Now, over the last week, she has had progressive weakness and shortness of breath when working with PT at rehabilitation. At night she also describes spells of vague chest discomfort that are intermittent and resolve with lorazepam. She has not had leg swelling, orthopnea, paroxysmal nocturnal dyspnea, frank chest pain, palpitations, or fever, chills, cough, dyspnea at rest, rigors, or sputum.  She was admitted to [facility] last night after a CT angiogram and chest x-ray suggested pulmonary edema without PE, and she was thought to have acute diastolic CHF. She ruled out with serial troponins overnight. The patient's family requested transfer to [facility] because they were hoping to see new doctors.  REQUESTED RECORDS FROM [facility]  Assessment/Plan: Afib with RVR:   new? The patient has no history of Afib until earlier this month. CHADs2Vasc 5 (CHF, HTN, Age, sex). She claims she was on a drip for 5 days and then converted to an oral agent for HR control, but the only nodal blocker I see she is on  at admission to [facility] is bisoprolol 2.5 mg.  -Diltiazem 10 mg then infusion, titrate to HR 65-105 -Hold bispoprolol/HCTZ and ACEi for now given soft BP at admission while on diltiazem -Consult to Cardiology, appreciate care -heparin gtt- d/c coumadin-- does not appear to have been d/c'd on any anticoagulant -TSH ok  Chronic diastolic CHF:  Old history of ischemic CM. Recent echo with EF 55%. -IV Lasix 40 mg daily -does not look overtly volume overloaded   HTN and CAD:  Remote history in Dr. Tennis Must Gent's notes of abnormal stress, decreased LVF, but recent echo with pEF. Not on statin. -Diltiazem gtt- transition over to PO  Hyponatremia:  In setting of Afib and resulting CHF.   Anxiety:  -May continue lorazepam PRN  Code Status: DNR Family Communication: patient Disposition Plan:    Consultants:  cards  Procedures:    HPI/Subjective: No CP, no SOB -has not diuresed much  Objective: Filed Vitals:   01/08/16 0432 01/08/16 0830  BP: 127/87 127/76  Pulse: 117 154  Temp: 98.1 F (36.7 C)   Resp: 20     Intake/Output Summary (Last 24 hours) at 01/08/16 1131 Last data filed at 01/08/16 1023  Gross per 24 hour  Intake 1152.4 ml  Output    650 ml  Net  502.4 ml   Filed Weights   01/06/16 2222 01/07/16 0330 01/08/16 0432  Weight: 56.473 kg (124 lb 8 oz) 56.065 kg (123 lb 9.6 oz) 54.976 kg (121 lb 3.2 oz)    Exam:   General:  Awake,  NAD  Cardiovascular: irr  Respiratory: clear  Abdomen: +BS, soft  Musculoskeletal: min edema   Data Reviewed: Basic Metabolic Panel:  Recent Labs Lab 01/07/16 0320 01/08/16 0435  NA 138 136  K 3.3* 3.3*  CL 98* 102  CO2 31 26  GLUCOSE 94 107*  BUN 14 10  CREATININE 0.67 0.56  CALCIUM 9.1 8.7*  MG 2.3  --    Liver Function Tests: No results for input(s): AST, ALT, ALKPHOS, BILITOT, PROT, ALBUMIN in the last 168 hours. No results for input(s): LIPASE, AMYLASE in the last 168 hours. No results for  input(s): AMMONIA in the last 168 hours. CBC:  Recent Labs Lab 01/07/16 0320 01/08/16 0435  WBC 8.6 9.5  HGB 13.0 13.0  HCT 38.4 40.0  MCV 94.6 95.5  PLT 324 331   Cardiac Enzymes:  Recent Labs Lab 01/07/16 1256 01/07/16 1930 01/07/16 2315  TROPONINI <0.03 <0.03 <0.03   BNP (last 3 results) No results for input(s): BNP in the last 8760 hours.  ProBNP (last 3 results) No results for input(s): PROBNP in the last 8760 hours.  CBG: No results for input(s): GLUCAP in the last 168 hours.  Recent Results (from the past 240 hour(s))  MRSA PCR Screening     Status: None   Collection Time: 01/06/16 10:25 PM  Result Value Ref Range Status   MRSA by PCR NEGATIVE NEGATIVE Final    Comment:        The GeneXpert MRSA Assay (FDA approved for NASAL specimens only), is one component of a comprehensive MRSA colonization surveillance program. It is not intended to diagnose MRSA infection nor to guide or monitor treatment for MRSA infections.      Studies: No results found.  Scheduled Meds: . furosemide  40 mg Intravenous Daily  . pantoprazole  40 mg Oral Daily   Continuous Infusions: . diltiazem (CARDIZEM) infusion 15 mg/hr (01/08/16 1044)  . heparin 800 Units/hr (01/07/16 1427)   Antibiotics Given (last 72 hours)    None      Principal Problem:   Atrial fibrillation with RVR (HCC) Active Problems:   Chronic diastolic CHF (congestive heart failure) (HCC)   Hyponatremia   Leukocytosis   Essential hypertension   Atherosclerosis of native coronary artery of native heart without angina pectoris    Time spent: 25 min    [facility] Pager [phone]. If 7PM-7AM, please contact night-coverage at [email or web address], password [facility] 01/08/2016, 11:31 AM  LOS: 2 days

## 2016-01-08 NOTE — Progress Notes (Signed)
Patient Name: [name] [name] Date of Encounter: 01/08/2016  Principal Problem:   Atrial fibrillation with RVR (HCC) Active Problems:   Chronic diastolic CHF (congestive heart failure) (HCC)   Hyponatremia   Leukocytosis   Essential hypertension   Atherosclerosis of native coronary artery of native heart without angina pectoris   Length of Stay: 2  SUBJECTIVE  The patient feels weak and SOB.  CURRENT MEDS . furosemide  40 mg Intravenous Daily  . pantoprazole  40 mg Oral Daily   . diltiazem (CARDIZEM) infusion 15 mg/hr (01/08/16 1044)  . heparin 800 Units/hr (01/07/16 1427)   OBJECTIVE  Filed Vitals:   01/07/16 1226 01/07/16 2050 01/08/16 0432 01/08/16 0830  BP: 125/54 114/68 127/87 127/76  Pulse: 90 131 117 154  Temp: 98.2 F (36.8 C) 98 F (36.7 C) 98.1 F (36.7 C)   TempSrc: Oral Oral Oral   Resp: 20 20 20    Height:      Weight:   121 lb 3.2 oz (54.976 kg)   SpO2: 99% 96% 97% 96%    Intake/Output Summary (Last 24 hours) at 01/08/16 1204 Last data filed at 01/08/16 1203  Gross per 24 hour  Intake 1152.4 ml  Output   1650 ml  Net -497.6 ml   Filed Weights   01/06/16 2222 01/07/16 0330 01/08/16 0432  Weight: 124 lb 8 oz (56.473 kg) 123 lb 9.6 oz (56.065 kg) 121 lb 3.2 oz (54.976 kg)    PHYSICAL EXAM  General: Pleasant, NAD. Neuro: Alert and oriented X 3. Moves all extremities spontaneously. Psych: Normal affect. HEENT:  Normal  Neck: Supple without bruits or JVD. Lungs:  Resp regular and unlabored, minimal crackles at the bases. Heart: RRR no s3, s4, or murmurs. Abdomen: Soft, non-tender, non-distended, BS + x 4.  Extremities: No clubbing, cyanosis or edema. DP/PT/Radials 2+ and equal bilaterally.  Accessory Clinical Findings  CBC  Recent Labs  01/07/16 0320 01/08/16 0435  WBC 8.6 9.5  HGB 13.0 13.0  HCT 38.4 40.0  MCV 94.6 95.5  PLT 324 [id number]   Basic Metabolic Panel  Recent Labs  01/07/16 0320 01/08/16 0435  NA 138 136  K  3.3* 3.3*  CL 98* 102  CO2 31 26  GLUCOSE 94 107*  BUN 14 10  CREATININE 0.67 0.56  CALCIUM 9.1 8.7*  MG 2.3  --     Recent Labs  01/07/16 1256 01/07/16 1930 01/07/16 2315  TROPONINI <0.03 <0.03 <0.03    Recent Labs  01/07/16 1256  TSH 0.906   TELE: a-fib with RVR, ventricular rate up to 150 BPM   ASSESSMENT AND PLAN  Principal Problem:  Atrial fibrillation with RVR (HCC) Active Problems:  Chronic diastolic CHF (congestive heart failure) (HCC)  Hyponatremia  Leukocytosis  Essential hypertension  Atherosclerosis of native coronary artery of native heart without angina pectoris  82 y/o female with h/o chronic systolic + diastolic CHF, chronic LBBB and HTN admitted for new onset atrial fibrillation.  She was last seen by Dr. Dannielle Burn, from [facility], in 2008. EF at that time was 40%. Stress echo was negative for ischemia, thus no cath was performed. It appears she was lost to f/u. She has been hospitalized 3x in the last 2 weeks and was diagnosed with a new onset atrial fibrillation with RVR and combined acute on hronic systolic and diastolic CHF.  Currently on iv Cardizem with persistent ventricular tachycardia.  We will d/c Cardizem, start amiodarone drip and plan for TEE/DCCV for  tomorrow.   I think that her CHF are sec to a-fib with RVR, its difficult to control her HR with bisoprolol and iv Cardizem. Her CHADS-VASc is 5, she was restarted on heparin drip. She will need long tern anticoagulation with NOAC. Hb is normal. She is mildly fluid overloaded, diuresing well with a low dose lasix, we will continue, crea is normal.  Signed, Dorothy Spark MD, [facility] 01/08/2016

## 2016-01-09 ENCOUNTER — Inpatient Hospital Stay (HOSPITAL_COMMUNITY): Payer: Medicare Other

## 2016-01-09 DIAGNOSIS — I4891 Unspecified atrial fibrillation: Secondary | ICD-10-CM

## 2016-01-09 DIAGNOSIS — I447 Left bundle-branch block, unspecified: Secondary | ICD-10-CM

## 2016-01-09 DIAGNOSIS — E876 Hypokalemia: Secondary | ICD-10-CM

## 2016-01-09 LAB — BASIC METABOLIC PANEL
Anion gap: 12 (ref 5–15)
BUN: 8 mg/dL (ref 6–20)
CHLORIDE: 100 mmol/L — AB (ref 101–111)
CO2: 27 mmol/L (ref 22–32)
Calcium: 9.2 mg/dL (ref 8.9–10.3)
Creatinine, Ser: 0.53 mg/dL (ref 0.44–1.00)
GFR calc Af Amer: >60 mL/min (ref >60)
GLUCOSE: 98 mg/dL (ref 65–99)
POTASSIUM: 3.5 mmol/L (ref 3.5–5.1)
Sodium: 139 mmol/L (ref 135–145)

## 2016-01-09 LAB — CBC
HEMATOCRIT: 38 % (ref 36.0–46.0)
HEMOGLOBIN: 12.7 g/dL (ref 12.0–15.0)
MCH: 32 pg (ref 26.0–34.0)
MCHC: 33.4 g/dL (ref 30.0–36.0)
MCV: 95.7 fL (ref 78.0–100.0)
Platelets: 316 10*3/uL (ref 150–400)
RBC: 3.97 MIL/uL (ref 3.87–5.11)
RDW: 14.9 % (ref 11.5–15.5)
WBC: 8.4 10*3/uL (ref 4.0–10.5)

## 2016-01-09 LAB — HEPARIN LEVEL (UNFRACTIONATED): Heparin Unfractionated: 0.41 IU/mL (ref 0.30–0.70)

## 2016-01-09 LAB — BRAIN NATRIURETIC PEPTIDE: B Natriuretic Peptide: 94.1 pg/mL (ref 0.0–100.0)

## 2016-01-09 MED ORDER — LORAZEPAM 0.5 MG PO TABS
0.5000 mg | ORAL_TABLET | ORAL | Status: DC | PRN
  Administered 2016-01-09 – 2016-01-10 (×4): 1 mg via ORAL
  Administered 2016-01-10: 0.5 mg via ORAL
  Administered 2016-01-10 – 2016-01-23 (×22): 1 mg via ORAL
  Filled 2016-01-09 (×25): qty 2
  Filled 2016-01-09 (×2): qty 1
  Filled 2016-01-09: qty 2
  Filled 2016-01-09: qty 1

## 2016-01-09 MED ORDER — SODIUM CHLORIDE 0.9% FLUSH
3.0000 mL | Freq: Two times a day (BID) | INTRAVENOUS | Status: DC
  Administered 2016-01-09 – 2016-01-10 (×2): 3 mL via INTRAVENOUS

## 2016-01-09 MED ORDER — AMIODARONE LOAD VIA INFUSION
150.0000 mg | Freq: Once | INTRAVENOUS | Status: AC
  Administered 2016-01-09: 150 mg via INTRAVENOUS
  Filled 2016-01-09: qty 83.34

## 2016-01-09 MED ORDER — AMIODARONE HCL IN DEXTROSE 360-4.14 MG/200ML-% IV SOLN
60.0000 mg/h | INTRAVENOUS | Status: AC
Start: 2016-01-09 — End: 2016-01-09
  Administered 2016-01-09: 60 mg/h via INTRAVENOUS
  Filled 2016-01-09: qty 200

## 2016-01-09 MED ORDER — POTASSIUM CHLORIDE 20 MEQ/15ML (10%) PO SOLN
40.0000 meq | Freq: Once | ORAL | Status: AC
  Administered 2016-01-09: 40 meq via ORAL
  Filled 2016-01-09: qty 30

## 2016-01-09 MED ORDER — FUROSEMIDE 20 MG PO TABS
20.0000 mg | ORAL_TABLET | Freq: Every day | ORAL | Status: DC
  Administered 2016-01-09: 20 mg via ORAL
  Filled 2016-01-09: qty 1

## 2016-01-09 MED ORDER — SODIUM CHLORIDE 0.9% FLUSH: 3.0000 mL | INTRAVENOUS | Status: DC | PRN

## 2016-01-09 MED ORDER — SODIUM CHLORIDE 0.9 % IV SOLN: 250.0000 mL | INTRAVENOUS | Status: DC

## 2016-01-09 MED ORDER — SODIUM CHLORIDE 0.9 % IV SOLN
INTRAVENOUS | Status: DC
  Administered 2016-01-09 (×2): via INTRAVENOUS

## 2016-01-09 MED ORDER — AMIODARONE HCL IN DEXTROSE 360-4.14 MG/200ML-% IV SOLN
60.0000 mg/h | INTRAVENOUS | Status: DC
  Administered 2016-01-09 – 2016-01-17 (×29): 60 mg/h via INTRAVENOUS
  Filled 2016-01-09 (×31): qty 200

## 2016-01-09 NOTE — Progress Notes (Signed)
  Echocardiogram 2D Echocardiogram has been performed.  Diamond Nickel 01/09/2016, 12:52 PM

## 2016-01-09 NOTE — Progress Notes (Addendum)
     SUBJECTIVE: Weakness but no pain or SOB  Tele: atrial fib, RVR, LBBB  BP 111/66 mmHg  Pulse 119  Temp(Src) 97.5 F (36.4 C) (Oral)  Resp 16  Ht 5' (1.524 m)  Wt 120 lb 3.2 oz (54.522 kg)  BMI 23.47 kg/m2  SpO2 90%  Intake/Output Summary (Last 24 hours) at 01/09/16 [id number] Last data filed at 01/09/16 0751  Gross per 24 hour  Intake 9010.83 ml  Output   1951 ml  Net 7059.83 ml    PHYSICAL EXAM General: Well developed, well nourished, in no acute distress. Alert and oriented x 3.  Psych:  Good affect, responds appropriately Neck: No JVD. No masses noted.  Lungs: Clear bilaterally with no wheezes or rhonci noted.  Heart: Irreg irreg with no murmurs noted. Abdomen: Bowel sounds are present. Soft, non-tender.  Extremities: No lower extremity edema.   LABS: Basic Metabolic Panel:  Recent Labs  01/07/16 0320 01/08/16 0435 01/09/16 0447  NA 138 136 139  K 3.3* 3.3* 3.5  CL 98* 102 100*  CO2 31 26 27   GLUCOSE 94 107* 98  BUN 14 10 8   CREATININE 0.67 0.56 0.53  CALCIUM 9.1 8.7* 9.2  MG 2.3  --   --    CBC:  Recent Labs  01/08/16 0435 01/09/16 0447  WBC 9.5 8.4  HGB 13.0 12.7  HCT 40.0 38.0  MCV 95.5 95.7  PLT 331 316   Cardiac Enzymes:  Recent Labs  01/07/16 1256 01/07/16 1930 01/07/16 2315  TROPONINI <0.03 <0.03 <0.03   Current Meds: . furosemide  40 mg Intravenous Daily  . pantoprazole  40 mg Oral Daily  . sodium chloride flush  3 mL Intravenous Q12H     ASSESSMENT AND PLAN:  1. Atrial fibrillation with RVR: She is still in atrial fib this am. Rate is not controlled on IV Cardizem. WE had planned TEE guided DCCV today but no room on schedule for the procedure. Will plan TEE guided DCCV tomorrow at 9am. NPO at midnight. Will changed IV Cardizem to IV amiodarone. She is on heparin. Plans for NOAC long term. Following cardioversion, may need to continue po amiodarone for at least the next few months.   2. Acute on Chronic systolic and  diastolic CHF: Mild volume overload, likely due to atrial fib with RVR. She appears euvolemic today. Will convert to po Lasix today. Echo pending.   3. Hypokalemia: Replace potassium this am.   4. LBBB: chronic  Shaquel Chavous  1/30/20178:08 AM

## 2016-01-09 NOTE — Progress Notes (Signed)
PROGRESS NOTE  [name] [name] [id number] DOB: Mar 17, 1932 DOA: 01/06/2016 PCP: [name] [name]., MD   [name] [name] is a 82 y.o. female with a past medical history significant for HTN and chronic systolic and diastolic CHF, but last EF 55% who presents with SOB and afib.  The patient was in her usual state of health until the beginning of this month when she developed dyspnea and cough, was observed overnight at [facility], treated for pneumonia and discharged. A week later, she developed chest discomfort and weakness, checked her HR on her BP cuff, found it to be elevated, went back to the hospital and was diagnosed with new onset Afib. She reports being "on a drip in the ICU" for five days, then converted to an oral agent (she thinks for HR control), seen by a Cardiologist who discussed cardioversion with her, and then being discharged to a rehab facility about a week ago.  Now, over the last week, she has had progressive weakness and shortness of breath when working with PT at rehabilitation. At night she also describes spells of vague chest discomfort that are intermittent and resolve with lorazepam. She has not had leg swelling, orthopnea, paroxysmal nocturnal dyspnea, frank chest pain, palpitations, or fever, chills, cough, dyspnea at rest, rigors, or sputum.  She was admitted to [facility] last night after a CT angiogram and chest x-ray suggested pulmonary edema without PE, and she was thought to have acute diastolic CHF. She ruled out with serial troponins overnight. The patient's family requested transfer to [facility] because they were hoping to see new doctors.  REQUESTED RECORDS FROM [facility]  Assessment/Plan: Afib with RVR:   new? The patient has no history of Afib until earlier this month. CHADs2Vasc 5 (CHF, HTN, Age, sex). She claims she was on a drip for 5 days and then converted to an oral agent for HR control, but the only nodal blocker I see she is on  at admission to [facility] is bisoprolol 2.5 mg.  -Diltiazem changed to amiodarone -Hold bispoprolol/HCTZ and ACEi for now given soft BP at admission while on diltiazem -Consult to Cardiology, appreciate care -heparin gtt- d/c coumadin-- does not appear to have been d/c'd on any anticoagulant -TSH ok  Chronic diastolic CHF:  Old history of ischemic CM. Recent echo with EF 55%. -IV Lasix 40 mg daily -does not look overtly volume overloaded   HTN and CAD:  Remote history in [name] [name]'s notes of abnormal stress, decreased LVF, but recent echo with pEF. Not on statin. -Diltiazem gtt-changed to amio  Hyponatremia:  In setting of Afib and resulting CHF.   Anxiety:  -May continue lorazepam PRN  Code Status: DNR Family Communication: patient Disposition Plan:    Consultants:  cards  Procedures:    HPI/Subjective: Some anxiety  Objective: Filed Vitals:   01/09/16 1008 01/09/16 1146  BP: 128/99 114/62  Pulse: 116 120  Temp:  98.9 F (37.2 C)  Resp:  16    Intake/Output Summary (Last 24 hours) at 01/09/16 1339 Last data filed at 01/09/16 0900  Gross per 24 hour  Intake 8190.83 ml  Output    801 ml  Net 7389.83 ml   Filed Weights   01/07/16 0330 01/08/16 0432 01/09/16 0502  Weight: 56.065 kg (123 lb 9.6 oz) 54.976 kg (121 lb 3.2 oz) 54.522 kg (120 lb 3.2 oz)    Exam:   General:  Awake, NAD  Cardiovascular: irr  Respiratory: clear  Abdomen: +BS, soft  Musculoskeletal: min  edema   Data Reviewed: Basic Metabolic Panel:  Recent Labs Lab 01/07/16 0320 01/08/16 0435 01/09/16 0447  NA 138 136 139  K 3.3* 3.3* 3.5  CL 98* 102 100*  CO2 31 26 27   GLUCOSE 94 107* 98  BUN 14 10 8   CREATININE 0.67 0.56 0.53  CALCIUM 9.1 8.7* 9.2  MG 2.3  --   --    Liver Function Tests: No results for input(s): AST, ALT, ALKPHOS, BILITOT, PROT, ALBUMIN in the last 168 hours. No results for input(s): LIPASE, AMYLASE in the last 168 hours. No results for  input(s): AMMONIA in the last 168 hours. CBC:  Recent Labs Lab 01/07/16 0320 01/08/16 0435 01/09/16 0447  WBC 8.6 9.5 8.4  HGB 13.0 13.0 12.7  HCT 38.4 40.0 38.0  MCV 94.6 95.5 95.7  PLT 324 331 316   Cardiac Enzymes:  Recent Labs Lab 01/07/16 1256 01/07/16 1930 01/07/16 2315  TROPONINI <0.03 <0.03 <0.03   BNP (last 3 results)  Recent Labs  01/09/16 0447  BNP 94.1    ProBNP (last 3 results) No results for input(s): PROBNP in the last 8760 hours.  CBG: No results for input(s): GLUCAP in the last 168 hours.  Recent Results (from the past 240 hour(s))  MRSA PCR Screening     Status: None   Collection Time: 01/06/16 10:25 PM  Result Value Ref Range Status   MRSA by PCR NEGATIVE NEGATIVE Final    Comment:        The GeneXpert MRSA Assay (FDA approved for NASAL specimens only), is one component of a comprehensive MRSA colonization surveillance program. It is not intended to diagnose MRSA infection nor to guide or monitor treatment for MRSA infections.      Studies: No results found.  Scheduled Meds: . furosemide  20 mg Oral Daily  . pantoprazole  40 mg Oral Daily  . sodium chloride flush  3 mL Intravenous Q12H   Continuous Infusions: . sodium chloride 20 mL/hr at 01/09/16 0715  . sodium chloride    . amiodarone 60 mg/hr (01/09/16 1004)   Followed by  . amiodarone    . heparin 800 Units/hr (01/07/16 1427)   Antibiotics Given (last 72 hours)    None      Principal Problem:   Atrial fibrillation with RVR (HCC) Active Problems:   Chronic diastolic CHF (congestive heart failure) (HCC)   Hyponatremia   Leukocytosis   Essential hypertension   Atherosclerosis of native coronary artery of native heart without angina pectoris   Acute on chronic combined systolic and diastolic congestive heart failure, NYHA class 3 ([place])    Time spent: 25 min    [place] [facility] Pager [phone]. If 7PM-7AM, please contact night-coverage at  [email or web address], password [facility] 01/09/2016, 1:39 PM  LOS: 3 days

## 2016-01-09 NOTE — Progress Notes (Signed)
Patient's heart rate persistently > 120's, max of 150's, provider notified, awaiting response. Patient on amiodarone drip, dose kept at 1mg /min.  Will continue to monitor and evaluate pt.

## 2016-01-09 NOTE — Care Management Important Message (Signed)
Important Message  Patient Details  Name: [name] [name] MRN: [id number] Date of Birth: 12-31-1931   Medicare Important Message Given:  Yes    Meeka Cartelli P Najmo Pardue 01/09/2016, 2:50 PM

## 2016-01-09 NOTE — Progress Notes (Signed)
Received consult for -  patient's insurance cover TSOAC like apixaban or rivaroxaban?  Patient has Medicare A&B along with [facility] but she does not have prescription drug coverage per benefit check. CM talked to patient to verify information concerning prescription coverage. Patient stated that she has been paying full price for her medication and has talked to her insurance about this issue three times. Daughter at bedside, CM encouraged her to call [place] and talk to a representative to see if she can get additional support.  If Attending MD decides to place patient on this medication at discharge, CM will give her a coupon for a 30 day supply of medication and to call the Eliquis medication assistance  Program to see if she will qualify for ongoing assistance. Mindi Slicker [facility] [phone]

## 2016-01-09 NOTE — Progress Notes (Signed)
ANTICOAGULATION CONSULT NOTE - Follow Up Consult  Pharmacy Consult for Heparin Indication: atrial fibrillation  Patient Measurements: Height: 5' (152.4 cm) Weight: 120 lb 3.2 oz (54.522 kg) (Scale B) IBW/kg (Calculated) : 45.5 Heparin Dosing Weight: 54.5 kg  Vital Signs: Temp: 99.6 F (37.6 C) (01/30 0800) Temp Source: Oral (01/30 0800) BP: 128/99 mmHg (01/30 1008) Pulse Rate: 116 (01/30 1008)  Labs:  Recent Labs  01/07/16 0320 01/07/16 1256  01/07/16 1930 01/07/16 2315 01/08/16 0435 01/08/16 1250 01/09/16 0447  HGB 13.0  --   --   --   --  13.0  --  12.7  HCT 38.4  --   --   --   --  40.0  --  38.0  PLT 324  --   --   --   --  331  --  316  LABPROT 14.2  --   --   --   --   --   --   --   INR 1.08  --   --   --   --   --   --   --   HEPARINUNFRC  --   --   < > 0.38  --  0.46 0.46 0.41  CREATININE 0.67  --   --   --   --  0.56  --  0.53  TROPONINI  --  <0.03  --  <0.03 <0.03  --   --   --   < > = values in this interval not displayed.  Estimated Creatinine Clearance: 39.6 mL/min (by C-G formula based on Cr of 0.53).  Assessment:   Heparin level remains therapeutic (0.41) on 800 units/hr.   CBC stable.  Noted plans for TEE/DCCV on 1/31.   Goal of Therapy:  Heparin level 0.3-0.7 units/ml Monitor platelets by anticoagulation protocol: Yes   Plan:   Continue heparin drip at 800 units/hr.  Daily heparin level and CBC.  Follow up for conversion to oral anticoagulation post-cardioversion.  Arty Baumgartner, [place] Pager: [phone] 01/09/2016,10:38 AM

## 2016-01-10 ENCOUNTER — Inpatient Hospital Stay (HOSPITAL_COMMUNITY): Payer: Medicare Other

## 2016-01-10 DIAGNOSIS — R0602 Shortness of breath: Secondary | ICD-10-CM | POA: Insufficient documentation

## 2016-01-10 LAB — CBC WITH DIFFERENTIAL/PLATELET
BASOS PCT: 0 %
Basophils Absolute: 0 10*3/uL (ref 0.0–0.1)
EOS ABS: 0 10*3/uL (ref 0.0–0.7)
Eosinophils Relative: 0 %
HCT: 41.7 % (ref 36.0–46.0)
Hemoglobin: 13.7 g/dL (ref 12.0–15.0)
LYMPHS ABS: 0.6 10*3/uL — AB (ref 0.7–4.0)
Lymphocytes Relative: 3 %
MCH: 31.2 pg (ref 26.0–34.0)
MCHC: 32.9 g/dL (ref 30.0–36.0)
MCV: 95 fL (ref 78.0–100.0)
MONO ABS: 1.4 10*3/uL — AB (ref 0.1–1.0)
MONOS PCT: 8 %
Neutro Abs: 15.1 10*3/uL — ABNORMAL HIGH (ref 1.7–7.7)
Neutrophils Relative %: 89 %
Platelets: 379 10*3/uL (ref 150–400)
RBC: 4.39 MIL/uL (ref 3.87–5.11)
RDW: 14.6 % (ref 11.5–15.5)
WBC: 17.1 10*3/uL — ABNORMAL HIGH (ref 4.0–10.5)

## 2016-01-10 LAB — CBC
HCT: 43.5 % (ref 36.0–46.0)
Hemoglobin: 14.2 g/dL (ref 12.0–15.0)
MCH: 31.4 pg (ref 26.0–34.0)
MCHC: 32.6 g/dL (ref 30.0–36.0)
MCV: 96.2 fL (ref 78.0–100.0)
PLATELETS: 480 10*3/uL — AB (ref 150–400)
RBC: 4.52 MIL/uL (ref 3.87–5.11)
RDW: 14.6 % (ref 11.5–15.5)
WBC: 21.8 10*3/uL — AB (ref 4.0–10.5)

## 2016-01-10 LAB — BASIC METABOLIC PANEL
Anion gap: 18 — ABNORMAL HIGH (ref 5–15)
Anion gap: 10 (ref 5–15)
BUN: 17 mg/dL (ref 6–20)
BUN: 17 mg/dL (ref 6–20)
CALCIUM: 8.8 mg/dL — AB (ref 8.9–10.3)
CALCIUM: 8.6 mg/dL — AB (ref 8.9–10.3)
CO2: 19 mmol/L — ABNORMAL LOW (ref 22–32)
CO2: 22 mmol/L (ref 22–32)
CREATININE: 1.07 mg/dL — AB (ref 0.44–1.00)
CREATININE: 0.88 mg/dL (ref 0.44–1.00)
Chloride: 96 mmol/L — ABNORMAL LOW (ref 101–111)
Chloride: 103 mmol/L (ref 101–111)
GFR calc Af Amer: 54 mL/min — ABNORMAL LOW (ref >60)
GFR calc Af Amer: >60 mL/min (ref >60)
GFR calc non Af Amer: 60 mL/min — ABNORMAL LOW (ref >60)
GFR, EST NON AFRICAN AMERICAN: 47 mL/min — AB (ref >60)
GLUCOSE: 338 mg/dL — AB (ref 65–99)
GLUCOSE: 168 mg/dL — AB (ref 65–99)
Potassium: 3.4 mmol/L — ABNORMAL LOW (ref 3.5–5.1)
Potassium: 4.3 mmol/L (ref 3.5–5.1)
Sodium: 133 mmol/L — ABNORMAL LOW (ref 135–145)
Sodium: 135 mmol/L (ref 135–145)

## 2016-01-10 LAB — HEPARIN LEVEL (UNFRACTIONATED): Heparin Unfractionated: 0.54 IU/mL (ref 0.30–0.70)

## 2016-01-10 LAB — GLUCOSE, CAPILLARY: Glucose-Capillary: 191 mg/dL — ABNORMAL HIGH (ref 65–99)

## 2016-01-10 LAB — MRSA PCR SCREENING: MRSA BY PCR: NEGATIVE

## 2016-01-10 MED ORDER — LEVALBUTEROL HCL 1.25 MG/0.5ML IN NEBU
1.2500 mg | INHALATION_SOLUTION | Freq: Once | RESPIRATORY_TRACT | Status: AC
  Administered 2016-01-10: 1.25 mg via RESPIRATORY_TRACT
  Filled 2016-01-10: qty 0.5

## 2016-01-10 MED ORDER — FUROSEMIDE 10 MG/ML IJ SOLN
40.0000 mg | Freq: Once | INTRAMUSCULAR | Status: AC
  Administered 2016-01-10: 40 mg via INTRAVENOUS

## 2016-01-10 MED ORDER — FUROSEMIDE 10 MG/ML IJ SOLN
INTRAMUSCULAR | Status: AC
  Administered 2016-01-10: 40 mg via INTRAVENOUS
  Filled 2016-01-10: qty 4

## 2016-01-10 MED ORDER — FUROSEMIDE 10 MG/ML IJ SOLN
40.0000 mg | Freq: Two times a day (BID) | INTRAMUSCULAR | Status: DC
  Administered 2016-01-10 – 2016-01-12 (×5): 40 mg via INTRAVENOUS
  Filled 2016-01-10 (×5): qty 4

## 2016-01-10 MED ORDER — POTASSIUM CHLORIDE CRYS ER 20 MEQ PO TBCR
40.0000 meq | EXTENDED_RELEASE_TABLET | Freq: Once | ORAL | Status: AC
  Administered 2016-01-10: 40 meq via ORAL
  Filled 2016-01-10: qty 2

## 2016-01-10 MED ORDER — LEVALBUTEROL HCL 0.63 MG/3ML IN NEBU: 0.6300 mg | INHALATION_SOLUTION | Freq: Four times a day (QID) | RESPIRATORY_TRACT | Status: DC | PRN

## 2016-01-10 MED ORDER — FUROSEMIDE 10 MG/ML IJ SOLN
40.0000 mg | Freq: Once | INTRAMUSCULAR | Status: AC
  Administered 2016-01-10: 40 mg via INTRAVENOUS
  Filled 2016-01-10: qty 4

## 2016-01-10 MED ORDER — MORPHINE SULFATE (PF) 2 MG/ML IV SOLN
2.0000 mg | INTRAVENOUS | Status: DC | PRN
  Administered 2016-01-10 – 2016-01-14 (×8): 2 mg via INTRAVENOUS
  Administered 2016-01-14: 1 mg via INTRAVENOUS
  Administered 2016-01-15 – 2016-01-22 (×15): 2 mg via INTRAVENOUS
  Filled 2016-01-10 (×24): qty 1

## 2016-01-10 NOTE — Progress Notes (Signed)
Pt arrived to room 2c02 from [facility] at this time.  No s/s of any acute distress noted.  Pt satting 95% on 4L O2 via n.c.

## 2016-01-10 NOTE — Progress Notes (Signed)
MD called back re sustained tachycardia max of 150's, no new orders placed.  Will monitor at intervals.

## 2016-01-10 NOTE — Progress Notes (Signed)
PROGRESS NOTE  [name] [name] [id number] DOB: 05-08-1932 DOA: 01/06/2016 PCP: Glenda Chroman., MD   [name] [name] is a 82 y.o. female with a past medical history significant for HTN and chronic systolic and diastolic CHF, but last EF 55% who presents with SOB and afib.  The patient was in her usual state of health until the beginning of this month when she developed dyspnea and cough, was observed overnight at [facility], treated for pneumonia and discharged. A week later, she developed chest discomfort and weakness, checked her HR on her BP cuff, found it to be elevated, went back to the hospital and was diagnosed with new onset Afib. She reports being "on a drip in the ICU" for five days, then converted to an oral agent (she thinks for HR control), seen by a Cardiologist who discussed cardioversion with her, and then being discharged to a rehab facility about a week ago.  Now, over the last week, she has had progressive weakness and shortness of breath when working with PT at rehabilitation. At night she also describes spells of vague chest discomfort that are intermittent and resolve with lorazepam. She has not had leg swelling, orthopnea, paroxysmal nocturnal dyspnea, frank chest pain, palpitations, or fever, chills, cough, dyspnea at rest, rigors, or sputum.  She was admitted to [facility] last night after a CT angiogram and chest x-ray suggested pulmonary edema without PE, and she was thought to have acute diastolic CHF. She ruled out with serial troponins overnight. The patient's family requested transfer to [facility] because they were hoping to see new doctors.    Assessment/Plan: Afib with RVR:   new? The patient has no history of Afib until earlier this month. CHADs2Vasc 5 (CHF, HTN, Age, sex). She claims she was on a drip for 5 days and then converted to an oral agent for HR control, but the only nodal blocker I see she is on at admission to [facility] is  bisoprolol 2.5 mg.  -Diltiazem changed to amiodarone but still not controlled -Hold bispoprolol/HCTZ and ACEi for now -Consult to Cardiology, appreciate care -heparin gtt- d/c coumadin-- does not appear to have been d/c'd on any anticoagulant -TSH ok -needs cardioversion  Chronic diastolic CHF:  Old history of ischemic CM. Recent echo with EF 55%. -IV Lasix 40 mg BID -appears to have developed more fluid overnight--- has episode of de-saturation   HTN and CAD:  Remote history in Dr. Tennis Must Gent's notes of abnormal stress, decreased LVF, but recent echo with pEF. Not on statin. -Diltiazem gtt-changed to amio  Hyponatremia:  In setting of Afib and resulting CHF.   Anxiety:  -May continue lorazepam PRN  Leukocytosis -?reactive -no fever  Place in SDU until after cardioversion as respiratory status tenuous  Code Status: DNR Family Communication: patient Disposition Plan:    Consultants:  cards  Procedures:    HPI/Subjective: Weight up Requiring 4L O2  Objective: Filed Vitals:   01/10/16 1300 01/10/16 1400  BP: 116/78 128/99  Pulse: 123 114  Temp:    Resp: 22 21    Intake/Output Summary (Last 24 hours) at 01/10/16 1407 Last data filed at 01/10/16 1400  Gross per 24 hour  Intake 2837.57 ml  Output   1575 ml  Net 1262.57 ml   Filed Weights   01/08/16 0432 01/09/16 0502 01/10/16 0505  Weight: 54.976 kg (121 lb 3.2 oz) 54.522 kg (120 lb 3.2 oz) 57.2 kg (126 lb 1.7 oz)    Exam:   General:  Awake, NAD  Cardiovascular: irr and fast  Respiratory: crackles  Abdomen: +BS, soft  Musculoskeletal: min edema   Data Reviewed: Basic Metabolic Panel:  Recent Labs Lab 01/07/16 0320 01/08/16 0435 01/09/16 0447 01/10/16 0519 01/10/16 1103  NA 138 136 139 133* 135  K 3.3* 3.3* 3.5 3.4* 4.3  CL 98* 102 100* 96* 103  CO2 31 26 27  19* 22  GLUCOSE 94 107* 98 338* 168*  BUN 14 10 8 17 17   CREATININE 0.67 0.56 0.53 1.07* 0.88  CALCIUM 9.1 8.7*  9.2 8.8* 8.6*  MG 2.3  --   --   --   --    Liver Function Tests: No results for input(s): AST, ALT, ALKPHOS, BILITOT, PROT, ALBUMIN in the last 168 hours. No results for input(s): LIPASE, AMYLASE in the last 168 hours. No results for input(s): AMMONIA in the last 168 hours. CBC:  Recent Labs Lab 01/07/16 0320 01/08/16 0435 01/09/16 0447 01/10/16 0519 01/10/16 1103  WBC 8.6 9.5 8.4 21.8* 17.1*  NEUTROABS  --   --   --   --  15.1*  HGB 13.0 13.0 12.7 14.2 13.7  HCT 38.4 40.0 38.0 43.5 41.7  MCV 94.6 95.5 95.7 96.2 95.0  PLT 324 331 316 480* 379   Cardiac Enzymes:  Recent Labs Lab 01/07/16 1256 01/07/16 1930 01/07/16 2315  TROPONINI <0.03 <0.03 <0.03   BNP (last 3 results)  Recent Labs  01/09/16 0447  BNP 94.1    ProBNP (last 3 results) No results for input(s): PROBNP in the last 8760 hours.  CBG:  Recent Labs Lab 01/10/16 0700  GLUCAP 191*    Recent Results (from the past 240 hour(s))  MRSA PCR Screening     Status: None   Collection Time: 01/06/16 10:25 PM  Result Value Ref Range Status   MRSA by PCR NEGATIVE NEGATIVE Final    Comment:        The GeneXpert MRSA Assay (FDA approved for NASAL specimens only), is one component of a comprehensive MRSA colonization surveillance program. It is not intended to diagnose MRSA infection nor to guide or monitor treatment for MRSA infections.      Studies: Dg Chest Port 1 View  01/10/2016  CLINICAL DATA:  Shortness of breath. EXAM: PORTABLE CHEST 1 VIEW COMPARISON:  Radiographs 01/05/2016, CT 01/06/2016 FINDINGS: Heart at the upper limits normal in size. Perihilar pulmonary edema has progressed from prior exam. Bilateral pleural effusions, likely increased on the right in the interim. No pneumothorax. Kyphoplasty noted in the upper thoracic spine. IMPRESSION: Progressive pulmonary edema and pleural effusions consistent with CHF. Electronically Signed   By: Jeb Levering M.D.   On: 01/10/2016 03:59     Scheduled Meds: . furosemide  40 mg Intravenous BID  . pantoprazole  40 mg Oral Daily   Continuous Infusions: . amiodarone 60 mg/hr (01/10/16 0142)  . heparin 800 Units/hr (01/09/16 2349)   Antibiotics Given (last 72 hours)    None      Principal Problem:   Atrial fibrillation with RVR (HCC) Active Problems:   Chronic diastolic CHF (congestive heart failure) (HCC)   Hyponatremia   Leukocytosis   Essential hypertension   Atherosclerosis of native coronary artery of native heart without angina pectoris   Acute on chronic combined systolic and diastolic congestive heart failure, NYHA class 3 ([place])    Time spent: 25 min    [facility] Pager [phone]. If 7PM-7AM, please contact night-coverage at [email or web address], password [facility]  01/10/2016, 2:07 PM  LOS: 4 days

## 2016-01-10 NOTE — Progress Notes (Signed)
Patient noted to be in respiratory distress, wheezing initially appreciated over right lung field, provider notified.  Provider ordered for one dose of xopenex, given with minimal relief, provider updated, ordered 40 mg of furosemide Iv, given.  Rapid response called, at bedside.  Patient switched to non re breather mask. cxr done.  Will continue to monitor ad evaluate patient.

## 2016-01-10 NOTE — Progress Notes (Signed)
To bedside for follow up. Pt resting, easily arouses, denies pain. 02 sats 97-98 on NRB weaned to 6 LNC. Lung sounds slightly course scattered rhonchi. Po2 sats 93-95% on 6 LNC. RN advised to monitor Patient closely and wean o2 if tolerated.

## 2016-01-10 NOTE — Progress Notes (Signed)
Pt has orders to be transferred to SDU. Bed on 2c02. Report called. Pt in stable condition, family at bedside. Will transfer pt with charge RN.   Maurene Capes RN

## 2016-01-10 NOTE — H&P (View-Only) (Signed)
Patient ID: [name] [name] MRN: [id number], DOB/AGE: 04/08/1932   Admit date: 01/06/2016  Primary Physician: Glenda Chroman., MD Primary Cardiologist: [facility] (seen by Dr. Dannielle Burn in 2008)  Pt. Profile:  82 y/o female with h/o chronic systolic + diastolic CHF, chronic LBBB and HTN admitted for new onset atrial fibrillation.   Problem List  Past Medical History  Diagnosis Date  . Osteoporosis   . Hypertension   . Colon cancer ([place])     1980  . Pneumonia 12/2015  . GERD (gastroesophageal reflux disease)   . Osteoarthritis     "all over"  . Anxiety     Past Surgical History  Procedure Laterality Date  . Colectomy  1980    "cancer"  . Rectal surgery  1998    "enlarged rectum; cut out some hemorrhoids"  . Cataract extraction w/ intraocular lens  implant, bilateral Bilateral 2010  . Knee arthroscopy Right 1999  . Hand nerve repair Right 1983    "fell on a bowl and hit right on my wrist; cut nerves/tendons"  . Tonsillectomy  1943  . Laparoscopic cholecystectomy  early 2000s  . Vaginal hysterectomy  1966?     Allergies  Allergies  Allergen Reactions  . Penicillins   . Sulfa Drugs Cross Reactors Hives    HPI  82 y/o female with h/o chronic systolic + diastolic CHF, chronic LBBB and HTN admitted for new onset atrial fibrillation. She was last seen by Dr. Dannielle Burn, from [facility], in 2008. EF at that time was 40%. Stress echo was negative for ischemia, thus no cath was performed. It appears she was lost to f/u.   The patient initially presented to [facility] several weeks ago with a complaint of shortness of breath and a cough. She was treated for PNA, monitored overnight and discharged home. She presented back 1 week later with chest discomfort and weakness + elevated HR. She was found to be in new onset atrial fibrillation. She was treated at [facility] with an IV drip and was seen by a cardiologist at the time, who discussed  cardioversion with her. She was discharged to a rehab facility with plans to f/u with cardiology at [facility]. Per records in [place], a consult was placed but no visit since discharge.   Over the past week, she has had worsening symptoms of weakness and dyspnea on exertion, prompting her to report back to [facility]. CT angiogram and chest x-ray suggested pulmonary edema without PE, and she was thought to have acute diastolic CHF. Cardiac enzymes were negative. The patient's family requested transfer to [facility] for further care.    On arrival to [facility], she remained in atrial fibrillation with a pulse rate in the low 100s -110s. BP is stable. TSH normal as well as Mg. K is low today at 3.3. She is afebrile. She denies any CP or dyspnea currently.    Home Medications  Prior to Admission medications   Medication Sig Start Date End Date Taking? Authorizing Provider  omeprazole (PRILOSEC) 20 MG capsule Take 20 mg by mouth daily.   Yes Historical Provider, MD  bisoprolol-hydrochlorothiazide (ZIAC) 2.5-6.25 MG per tablet Take 2 tablets by mouth daily.    Historical Provider, MD  calcium-vitamin D (OSCAL WITH D) 500-200 MG-UNIT per tablet Take 1 tablet by mouth daily.    Historical Provider, MD  Cholecalciferol (VITAMIN D-3 PO) Take 2,000 mg by mouth daily.    Historical Provider, MD  lisinopril (PRINIVIL,ZESTRIL) 10 MG  tablet Take 10 mg by mouth 2 (two) times daily.    Historical Provider, MD  LORazepam (ATIVAN) 1 MG tablet Take 0.5-1 mg by mouth every 8 (eight) hours as needed for anxiety.     Historical Provider, MD  polyethylene glycol (MIRALAX / GLYCOLAX) packet Take 17 g by mouth daily. One tablespoon a day    Historical Provider, MD  vitamin E (VITAMIN E) 400 UNIT capsule Take 400 Units by mouth daily.    Historical Provider, MD    Family History  Family History  Problem Relation Age of Onset  . Congestive Heart Failure Mother   . Prostate cancer Father   . Hypertension Father      Social History  Social History   Social History  . Marital Status: Divorced    Spouse Name: N/A  . Number of Children: N/A  . Years of Education: N/A   Occupational History  . Not on file.   Social History Main Topics  . Smoking status: Never Smoker   . Smokeless tobacco: Never Used  . Alcohol Use: No  . Drug Use: No  . Sexual Activity: No   Other Topics Concern  . Not on file   Social History Narrative     Review of Systems General:  No chills, fever, night sweats or weight changes.  Cardiovascular:  No chest pain, dyspnea on exertion, edema, orthopnea, palpitations, paroxysmal nocturnal dyspnea. Dermatological: No rash, lesions/masses Respiratory: No cough, dyspnea Urologic: No hematuria, dysuria Abdominal:   No nausea, vomiting, diarrhea, bright red blood per rectum, melena, or hematemesis Neurologic:  No visual changes, wkns, changes in mental status. All other systems reviewed and are otherwise negative except as noted above.  Physical Exam  Blood pressure 127/61, pulse 96, temperature 97.5 F (36.4 C), temperature source Oral, resp. rate 18, height 5' (1.524 m), weight 123 lb 9.6 oz (56.065 kg), SpO2 95 %.  General: [place], NAD Psych: Normal affect. Neuro: Alert and oriented X 3. Moves all extremities spontaneously. HEENT: Normal  Neck: Supple without bruits, + JVD. Lungs:  Resp regular and unlabored, CTA. Heart: irreg.irregular no s3, s4, or murmurs. Abdomen: Soft, non-tender, non-distended, BS + x 4.  Extremities: No clubbing, cyanosis or edema. DP/PT/Radials 2+ and equal bilaterally.  Labs  Troponin (Point of Care Test) No results for input(s): TROPIPOC in the last 72 hours. No results for input(s): CKTOTAL, CKMB, TROPONINI in the last 72 hours. Lab Results  Component Value Date   WBC 8.6 01/07/2016   HGB 13.0 01/07/2016   HCT 38.4 01/07/2016   MCV 94.6 01/07/2016   PLT 324 01/07/2016    Recent Labs Lab 01/07/16 0320  NA 138  K 3.3*   CL 98*  CO2 31  BUN 14  CREATININE 0.67  CALCIUM 9.1  GLUCOSE 94   ECG  Atrial fibrillation w/ RVR   ASSESSMENT AND PLAN  Principal Problem:   Atrial fibrillation with RVR (HCC) Active Problems:   Chronic diastolic CHF (congestive heart failure) (HCC)   Hyponatremia   Leukocytosis   Essential hypertension   Atherosclerosis of native coronary artery of native heart without angina pectoris  1. Atrial Fibrillation: patient remains in atrial fibrillation. TSH and Mg level both normal. K is low but supplemental K has been ordered. HR is poorly controlled in the 110s resting. She is symptomatic. Continue IV Cardizem for rate control. IV heparin has been ordered. We will obtain a 2D echo to assess LVF. We will also plan for TEE/DCCV on Monday  if still in atrial fibrillation. She will need oral anticoagulation post cardioversion. Renal function is normal. We will need to consider DOAC vs Coumadin, depending on cost, as patient is on Medicare.   2. Hypokalemia: K is 3.3 today. 40 mEq of K-dur ordered. F/u BMP in the am.   3. Acute on Chronic Combined Systolic + Diastolic CHF: prior assessment of systolic function in [id number] showed EF to be 40%. Stress echo negative for ischemia. She is volume overloaded. Continue IV Lasix. Monitor I/Os and daily weights.   Signed, Lyda Jester, PA-C 01/07/2016, 11:52 AM  The patient was seen, examined and discussed with Brittainy M. Rosita Fire, PA-C and I agree with the above.   82 y/o female with h/o chronic systolic + diastolic CHF, chronic LBBB and HTN admitted for new onset atrial fibrillation.  She was last seen by Dr. Dannielle Burn, from [facility], in 2008. EF at that time was 40%. Stress echo was negative for ischemia, thus no cath was performed. It appears she was lost to f/u. She has been hospitalized 3x in the last 2 weeks and was diagnosed with a new onset atrial fibrillation with RVR and combined acute on hronic systolic and diastolic CHF.  . Currently on iv Cardizem with persistent ventricular tachycardia.   I think that her CHF are sec to a-fib with RVR, its difficult to control her HR with bisoprolol and iv Cardizem. Her CHADS-VASc is 5, I will start heparin drip. She is mildly fluid overloaded, we will order a low dose lasix 20 mg iv bid, crea is normal. If she doesn't cardiovert till Monday, we will plan for a TEE/DCCV and long tern anticoagulation with NOAC. Hb is normal.   Dorothy Spark 01/07/2016

## 2016-01-10 NOTE — Progress Notes (Signed)
Inpatient Diabetes Program Recommendations  AACE/ADA: New Consensus Statement on Inpatient Glycemic Control (2015)  Target Ranges:  Prepandial:   less than 140 mg/dL      Peak postprandial:   less than 180 mg/dL (1-2 hours)      Critically ill patients:  140 - 180 mg/dL   Review of Glycemic Control:  Results for LOGYN, WOOLRIDGE (MRN [id number]) as of 01/10/2016 13:36  Ref. Range 01/10/2016 05:19 01/10/2016 11:03  Glucose Latest Ref Range: 65-99 mg/dL 338 (H) 168 (H)   Diabetes history: None Inpatient Diabetes Program Recommendations:    Note glucose elevated this AM.  If continues to be elevated, consider adding Novolog correction if appropriate.  Thanks, Adah Perl, RN, BC-ADM Inpatient Diabetes Coordinator Pager [phone] (8a-5p)

## 2016-01-10 NOTE — Interval H&P Note (Signed)
History and Physical Interval Note:  01/10/2016 4:34 PM  [name] [name]  has presented today for surgery, with the diagnosis of AFIB  The various methods of treatment have been discussed with the patient and family. After consideration of risks, benefits and other options for treatment, the patient has consented to  Procedure(s): TRANSESOPHAGEAL ECHOCARDIOGRAM (TEE) (N/A) CARDIOVERSION (N/A) as a surgical intervention .  The patient's history has been reviewed, patient examined, no change in status, stable for surgery.  I have reviewed the patient's chart and labs.  Questions were answered to the patient's satisfaction.     Jenkins Rouge

## 2016-01-10 NOTE — Progress Notes (Addendum)
     SUBJECTIVE: She is anxious this am. Breathing is better after Lasix. Still on supplemental O2.   BP 120/92 mmHg  Pulse 156  Temp(Src) 98.2 F (36.8 C) (Oral)  Resp 20  Ht 5' (1.524 m)  Wt 126 lb 1.7 oz (57.2 kg)  BMI 24.63 kg/m2  SpO2 93%  Intake/Output Summary (Last 24 hours) at 01/10/16 1132 Last data filed at 01/10/16 1012  Gross per 24 hour  Intake 2682.97 ml  Output   1375 ml  Net 1307.97 ml    PHYSICAL EXAM General: Elderly female, thin. Alert and oriented x 3. NAD Psych:  Good affect, responds appropriately Neck: + JVD. No masses noted.  Lungs: Bibasilar crackles. no wheezes or rhonci noted.  Heart: irreg irreg with no murmurs noted. Abdomen: Bowel sounds are present. Soft, non-tender.  Extremities: No lower extremity edema.   LABS: Basic Metabolic Panel:  Recent Labs  01/09/16 0447 01/10/16 0519  NA 139 133*  K 3.5 3.4*  CL 100* 96*  CO2 27 19*  GLUCOSE 98 338*  BUN 8 17  CREATININE 0.53 1.07*  CALCIUM 9.2 8.8*   CBC:  Recent Labs  01/10/16 0519 01/10/16 1103  WBC 21.8* 17.1*  NEUTROABS  --  15.1*  HGB 14.2 13.7  HCT 43.5 41.7  MCV 96.2 95.0  PLT 480* 379   Cardiac Enzymes:  Recent Labs  01/07/16 1256 01/07/16 1930 01/07/16 2315  TROPONINI <0.03 <0.03 <0.03   Current Meds: . furosemide  40 mg Intravenous BID  . pantoprazole  40 mg Oral Daily  . sodium chloride flush  3 mL Intravenous Q12H   Echo 01/09/16: Left ventricle: LVEF is approximately 35 to 40% with akinesis of the inferior/inferoseptal walls. The cavity size was normal. Wall thickness was increased in a pattern of moderate LVH. - Mitral valve: There was moderate regurgitation. - Left atrium: The atrium was mildly dilated. - Pulmonary arteries: PA peak pressure: 45 mm Hg (S).  ASSESSMENT AND PLAN:  1. Atrial fibrillation with RVR: She is still in atrial fib this am. Rate is still not optimally controlled on IV amiodarone. We were planning TEE guided DCCV  today as it could not be arranged yesterday due to the schedule. However, she now has acute respiratory distress overnight presumed to be due to volume overload. Will cancel TEE/DCCV as I do not think she can be safely sedated today. Unfortunately, I think DCCV will help her as her atrial fib is likely contributing to her CHF. Will continue IV amiodarone and IV heparin today. She is being moved to stepdown by the primary team. Will place on TEE schedule tomorrow. NPO at midnight tonight. OK to eat today.   2. Acute on Chronic systolic and diastolic CHF: She had respiratory issues this am felt to be due to volume overload. She has responded well to IV Lasix. LVEF is 40% on echo. Will continue IV Lasix.   3. Hypokalemia: Replace potassium this am.   4. LBBB: chronic  [name] [name]  1/31/201711:32 AM

## 2016-01-10 NOTE — Progress Notes (Signed)
Called per floor RN at 0320 regarding Pt with severe SOB. [facility] NP called prior to my arrival and 40 mg lasix ordered. Upon my arrival at 0330 Pt found in bed in acute respiratory distress, follows commands, denies pain, unable to speak in complete sentences. Lung sound course, wet crackles heard in all lobes. RR 30-40 Po2 92-94 on 6 LNC. Pt repositioned in bed, NRB placed. [facility] NP K. Schor paged and updated per floor RN. CXR, Morphine 2 mg and Foley catheter ordered. Foley placed, CXR done, morphine given at 0340. As of 0400 Pt with some improvement RR decreased 25-30, po2 92-94% on NRB, lung sounds remain with crackles some what improved. [facility] paged to update, 40 mg additional lasix ordered. RN advised to monitor Pt closely and notify Provider and myself if not improving. RRT will follow

## 2016-01-10 NOTE — Progress Notes (Signed)
Shift event note:  Notified by RN that pt c/o increased SOB. Required transition to NRB to keep sats > 90%. BBS w/ significant wheezes per RN assessment. Pt given Xopenex neb w/o significant relief. Per RN BBS w/ crackles after neb. RR RN was paged and responded to bedside. CXR and Lasix 40 mg ordered IV. NP to bedside. At bedside pt noted somewhat improved after IV Lasix and IV Morphine. Remains somewhat tachypneic (22-30). BBS w/ fine crackles.  CXR reveals "progressive pulmonary edema" and bil pleural effusions c/w CHF. Lasix 40 mg repeated. Foley placed d/t IV diuresis and intolerance to activity. Pt remains in A-Fib w/ RVR. HR has not been well controlled on Amiodarone. Cardiology service was paged earlier in the shift but did not want to add to or change current therapy. At the time of my departure pt appears to be resting better,  02 sats now 93-94% on 100% NRB mask. Assessment/Plan: 1. Dypsnea: CXR c/w worsening pulmonary edema. Improved after IV Lasix and IV Morphine. Currently on NRB, will wean back to [place] when tolerates. Pt stabilized in room. 2. A-Fib w/ RVR: Pt scheduled for TEE guided DCCV this am. Management per cardiology.  Will continue to monitor closely on telemetry.   Jeryl Columbia, NP-C [facility] Pager [phone]

## 2016-01-10 NOTE — Progress Notes (Signed)
ANTICOAGULATION CONSULT NOTE - Follow Up Consult  Pharmacy Consult for Heparin Indication: atrial fibrillation  Patient Measurements: Height: 5' (152.4 cm) Weight: 126 lb 1.7 oz (57.2 kg) (pt unstable) IBW/kg (Calculated) : 45.5 Heparin Dosing Weight: 57.2 kg  Vital Signs: BP: 120/92 mmHg (01/31 0604) Pulse Rate: 156 (01/31 0604)  Labs:  Recent Labs  01/07/16 1256  01/07/16 1930 01/07/16 2315  01/08/16 0435 01/08/16 1250 01/09/16 0447 01/10/16 0513 01/10/16 0519  HGB  --   --   --   --   < > 13.0  --  12.7  --  14.2  HCT  --   --   --   --   --  40.0  --  38.0  --  43.5  PLT  --   --   --   --   --  331  --  316  --  480*  HEPARINUNFRC  --   < > 0.38  --   --  0.46 0.46 0.41 0.54  --   CREATININE  --   --   --   --   --  0.56  --  0.53  --  1.07*  TROPONINI <0.03  --  <0.03 <0.03  --   --   --   --   --   --   < > = values in this interval not displayed.  Estimated Creatinine Clearance: 32.1 mL/min (by C-G formula based on Cr of 1.07).  Assessment:   Heparin level remains therapeutic (0.54) on 800 units/hr.   CBC ok.  Noted plans for TEE/DCCV today, but now transferring to step down.   Goal of Therapy:  Heparin level 0.3-0.7 units/ml Monitor platelets by anticoagulation protocol: Yes   Plan:   Continue heparin drip at 800 units/hr.  Daily heparin level and CBC.  Follow up for conversion to oral anticoagulation post-cardioversion.  Arty Baumgartner, [place] Pager: [phone] 01/10/2016,11:10 AM

## 2016-01-11 ENCOUNTER — Encounter (HOSPITAL_COMMUNITY)
Admission: AD | Disposition: A | Discharge status: Skilled Nursing Facility | Payer: Medicare Other | Source: Other Acute Inpatient Hospital | Attending: Internal Medicine

## 2016-01-11 ENCOUNTER — Inpatient Hospital Stay (HOSPITAL_COMMUNITY): Payer: Medicare Other | Admitting: Anesthesiology

## 2016-01-11 ENCOUNTER — Inpatient Hospital Stay (HOSPITAL_COMMUNITY): Payer: Medicare Other

## 2016-01-11 ENCOUNTER — Encounter (HOSPITAL_COMMUNITY): Payer: Self-pay | Admitting: *Deleted

## 2016-01-11 DIAGNOSIS — I4891 Unspecified atrial fibrillation: Secondary | ICD-10-CM

## 2016-01-11 DIAGNOSIS — I340 Nonrheumatic mitral (valve) insufficiency: Secondary | ICD-10-CM

## 2016-01-11 HISTORY — PX: TEE WITHOUT CARDIOVERSION: SHX5443

## 2016-01-11 HISTORY — PX: CARDIOVERSION: SHX1299

## 2016-01-11 LAB — CBC
HCT: 41.7 % (ref 36.0–46.0)
Hemoglobin: 13.8 g/dL (ref 12.0–15.0)
MCH: 31.9 pg (ref 26.0–34.0)
MCHC: 33.1 g/dL (ref 30.0–36.0)
MCV: 96.5 fL (ref 78.0–100.0)
PLATELETS: 446 10*3/uL — AB (ref 150–400)
RBC: 4.32 MIL/uL (ref 3.87–5.11)
RDW: 14.9 % (ref 11.5–15.5)
WBC: 17.4 10*3/uL — ABNORMAL HIGH (ref 4.0–10.5)

## 2016-01-11 LAB — HEPARIN LEVEL (UNFRACTIONATED): Heparin Unfractionated: 0.55 IU/mL (ref 0.30–0.70)

## 2016-01-11 SURGERY — ECHOCARDIOGRAM, TRANSESOPHAGEAL
Anesthesia: Monitor Anesthesia Care

## 2016-01-11 MED ORDER — SODIUM CHLORIDE 0.9 % IV SOLN
INTRAVENOUS | Status: DC | PRN
  Administered 2016-01-11: 13:00:00 via INTRAVENOUS

## 2016-01-11 MED ORDER — MENTHOL 3 MG MT LOZG
1.0000 | LOZENGE | OROMUCOSAL | Status: DC | PRN
  Administered 2016-01-11 – 2016-01-12 (×2): 3 mg via ORAL
  Filled 2016-01-11: qty 9

## 2016-01-11 MED ORDER — APIXABAN 2.5 MG PO TABS
2.5000 mg | ORAL_TABLET | Freq: Two times a day (BID) | ORAL | Status: DC
  Administered 2016-01-11 – 2016-01-23 (×24): 2.5 mg via ORAL
  Filled 2016-01-11 (×25): qty 1

## 2016-01-11 MED ORDER — PHENYLEPHRINE HCL 10 MG/ML IJ SOLN
INTRAMUSCULAR | Status: DC | PRN
  Administered 2016-01-11 (×2): 80 ug via INTRAVENOUS

## 2016-01-11 MED ORDER — LACTATED RINGERS IV SOLN
INTRAVENOUS | Status: DC
  Administered 2016-01-11 – 2016-01-15 (×2): via INTRAVENOUS

## 2016-01-11 MED ORDER — PROPOFOL 500 MG/50ML IV EMUL
INTRAVENOUS | Status: DC | PRN
  Administered 2016-01-11: 25 ug/kg/min via INTRAVENOUS

## 2016-01-11 MED ORDER — PROPOFOL 10 MG/ML IV BOLUS
INTRAVENOUS | Status: DC | PRN
  Administered 2016-01-11: 20 mg via INTRAVENOUS

## 2016-01-11 MED ORDER — BUTAMBEN-TETRACAINE-BENZOCAINE 2-2-14 % EX AERO
INHALATION_SPRAY | CUTANEOUS | Status: DC | PRN
  Administered 2016-01-11: 1 via TOPICAL

## 2016-01-11 NOTE — Anesthesia Preprocedure Evaluation (Addendum)
Anesthesia Evaluation  Patient identified by MRN, date of birth, ID band Patient awake    Reviewed: Allergy & Precautions, NPO status , Patient's Chart, lab work & pertinent test results  Airway Mallampati: II  TM Distance: >3 FB Neck ROM: Full    Dental no notable dental hx.    Pulmonary shortness of breath, pneumonia,    Pulmonary exam normal breath sounds clear to auscultation       Cardiovascular hypertension, + CAD and +CHF  negative cardio ROS Normal cardiovascular exam+ dysrhythmias  Rhythm:Regular Rate:Normal     Neuro/Psych PSYCHIATRIC DISORDERS Anxiety negative neurological ROS     GI/Hepatic negative GI ROS, Neg liver ROS,   Endo/Other  negative endocrine ROS  Renal/GU negative Renal ROS     Musculoskeletal  (+) Arthritis ,   Abdominal   Peds  Hematology negative hematology ROS (+)   Anesthesia Other Findings   Reproductive/Obstetrics negative OB ROS                            Anesthesia Physical Anesthesia Plan  ASA: III  Anesthesia Plan: MAC   Post-op Pain Management:    Induction: Intravenous  Airway Management Planned: Nasal Cannula  Additional Equipment:   Intra-op Plan:   Post-operative Plan:   Informed Consent: I have reviewed the patients History and Physical, chart, labs and discussed the procedure including the risks, benefits and alternatives for the proposed anesthesia with the patient or authorized representative who has indicated his/her understanding and acceptance.   Dental advisory given  Plan Discussed with: CRNA  Anesthesia Plan Comments:        Anesthesia Quick Evaluation

## 2016-01-11 NOTE — Progress Notes (Signed)
  Echocardiogram 2D Echocardiogram has been performed.  [name] [name] 01/11/2016, 2:07 PM

## 2016-01-11 NOTE — Discharge Instructions (Addendum)

## 2016-01-11 NOTE — Progress Notes (Signed)
ANTICOAGULATION CONSULT NOTE - Follow Up Consult  Pharmacy Consult for Heparin Indication: atrial fibrillation  Patient Measurements: Height: 5' (152.4 cm) Weight: 123 lb 7.3 oz (56 kg) IBW/kg (Calculated) : 45.5 Heparin Dosing Weight: 57.2 kg  Vital Signs: Temp: 98.3 F (36.8 C) (02/01 1137) Temp Source: Rectal (02/01 1137) BP: 129/83 mmHg (02/01 1420) Pulse Rate: 96 (02/01 1420)  Labs:  Recent Labs  01/09/16 0447 01/10/16 0513 01/10/16 0519 01/10/16 1103 01/11/16 0226 01/11/16 0330  HGB 12.7  --  14.2 13.7  --  13.8  HCT 38.0  --  43.5 41.7  --  41.7  PLT 316  --  480* 379  --  446*  HEPARINUNFRC 0.41 0.54  --   --  0.55  --   CREATININE 0.53  --  1.07* 0.88  --   --     Estimated Creatinine Clearance: 38.7 mL/min (by C-G formula based on Cr of 0.88).  Assessment:   Heparin level remains therapeutic (0.55) on 800 units/hr.   CBC ok, no bleeding reported.  S/p TEE/DCCV today. To convert to Eliquis today.    Goal of Therapy:  Heparin level 0.3-0.7 units/ml Monitor platelets by anticoagulation protocol: Yes   Plan:  DC heparin drip at 8pm tonight At 8 pm tonight start Eliquis 2.5 mg po BID I d/w RN, Fayrene Helper, Pharm.D. [id number] 01/11/2016 3:08 PM

## 2016-01-11 NOTE — Transfer of Care (Signed)
Immediate Anesthesia Transfer of Care Note  Patient: [name] [name]  Procedure(s) Performed: Procedure(s): TRANSESOPHAGEAL ECHOCARDIOGRAM (TEE) (N/A) CARDIOVERSION (N/A)  Patient Location: Endoscopy Unit  Anesthesia Type:MAC  Level of Consciousness: awake, alert  and oriented  Airway & Oxygen Therapy: Patient Spontanous Breathing and Patient connected to nasal cannula oxygen  Post-op Assessment: Report given to RN, Post -op Vital signs reviewed and stable and Patient moving all extremities X 4  Post vital signs: Reviewed and stable  Last Vitals:  Filed Vitals:   01/11/16 1245 01/11/16 1407  BP: 123/94 137/93  Pulse: 119 97  Temp:    Resp: 22 26    Complications: No apparent anesthesia complications

## 2016-01-11 NOTE — Progress Notes (Signed)
     SUBJECTIVE: Feels better. No chest pain. No SOB this am. Anxious about procedure.   Tele: Atrial fib, rate 100-120 bpm  BP 131/101 mmHg  Pulse 116  Temp(Src) 96.8 F (36 C) (Axillary)  Resp 21  Ht 5' (1.524 m)  Wt 123 lb 7.3 oz (56 kg)  BMI 24.11 kg/m2  SpO2 95%  Intake/Output Summary (Last 24 hours) at 01/11/16 [id number] Last data filed at 01/11/16 0700  Gross per 24 hour  Intake   1203 ml  Output    725 ml  Net    478 ml    PHYSICAL EXAM General: Thin elderly female in NAD. Well developed, well nourished. A and O x 3.  Psych:  Good affect, responds appropriately Neck: No JVD. No masses noted.  Lungs: Clear bilaterally with no wheezes or rhonci noted.  Heart: irreg irreg with no murmurs noted. Abdomen: Bowel sounds are present. Soft, non-tender.  Extremities: No lower extremity edema.   LABS: Basic Metabolic Panel:  Recent Labs  01/10/16 0519 01/10/16 1103  NA 133* 135  K 3.4* 4.3  CL 96* 103  CO2 19* 22  GLUCOSE 338* 168*  BUN 17 17  CREATININE 1.07* 0.88  CALCIUM 8.8* 8.6*   CBC:  Recent Labs  01/10/16 1103 01/11/16 0330  WBC 17.1* 17.4*  NEUTROABS 15.1*  --   HGB 13.7 13.8  HCT 41.7 41.7  MCV 95.0 96.5  PLT 379 446*   Current Meds: . furosemide  40 mg Intravenous BID  . pantoprazole  40 mg Oral Daily     ASSESSMENT AND PLAN:  1. Atrial fibrillation with RVR: She is still in atrial fib this am. Rate is still not optimally controlled on IV amiodarone. We were planning TEE guided DCCV yesterday but due to acute respiratory distress with pulm edema, this was cancelled. She is now on IV Lasix and her respiratory status is better. I think DCCV will help her as her atrial fib is likely contributing to her CHF. Will continue IV amiodarone and IV heparin today. She is NPO for procedure today.    2. Acute on Chronic systolic and diastolic CHF: She had respiratory issues yesterday felt to be due to volume overload. She has responded well to IV Lasix.  LVEF is 40% on echo. Will continue IV Lasix today.   3. Hypokalemia: Resolved with replacement yesteday.    4. LBBB: chronic  [name] [name]  2/1/20179:09 AM

## 2016-01-11 NOTE — Progress Notes (Signed)
PROGRESS NOTE  [name] [name] [id number] DOB: 08-04-1932 DOA: 01/06/2016 PCP: Glenda Chroman., MD   Subjective: Feels much better, still has "weird feeling in her chest", rate is not controlled.  HPI: [name] [name] is a 82 y.o. female with a past medical history significant for HTN and chronic systolic and diastolic CHF, but last EF 55% who presents with SOB and afib.  The patient was in her usual state of health until the beginning of this month when she developed dyspnea and cough, was observed overnight at [facility], treated for pneumonia and discharged. A week later, she developed chest discomfort and weakness, checked her HR on her BP cuff, found it to be elevated, went back to the hospital and was diagnosed with new onset Afib. She reports being "on a drip in the ICU" for five days, then converted to an oral agent (she thinks for HR control), seen by a Cardiologist who discussed cardioversion with her, and then being discharged to a rehab facility about a week ago.  Now, over the last week, she has had progressive weakness and shortness of breath when working with PT at rehabilitation. At night she also describes spells of vague chest discomfort that are intermittent and resolve with lorazepam. She has not had leg swelling, orthopnea, paroxysmal nocturnal dyspnea, frank chest pain, palpitations, or fever, chills, cough, dyspnea at rest, rigors, or sputum.  She was admitted to [facility] last night after a CT angiogram and chest x-ray suggested pulmonary edema without PE, and she was thought to have acute diastolic CHF. She ruled out with serial troponins overnight. The patient's family requested transfer to [facility] because they were hoping to see new doctors.    Assessment/Plan: Principal Problem:   Atrial fibrillation with RVR (HCC) Active Problems:   Chronic diastolic CHF (congestive heart failure) (HCC)   Hyponatremia   Leukocytosis   Essential  hypertension   Atherosclerosis of native coronary artery of native heart without angina pectoris   Acute on chronic combined systolic and diastolic congestive heart failure, NYHA class 3 (HCC)   SOB (shortness of breath)   Afib with RVR and aberrant conduction:  -New? The patient has no history of Afib until earlier this month. CHADs2Vasc 5 (CHF, HTN, Age, sex). She claims she was on a drip for 5 days and then converted to an oral agent for HR control, but the only nodal blocker I see she is on at admission to [facility] is bisoprolol 2.5 mg.  -Diltiazem changed to amiodarone but still not controlled -Hold bispoprolol/HCTZ and ACEi for now -Consult to Cardiology, appreciate care -heparin gtt- d/c coumadin-- does not appear to have been d/c'd on any anticoagulant -TSH ok -DCCV to be done today.  Chronic diastolic CHF:  -Old history of ischemic CM. Recent echo with EF 55%. -IV Lasix 40 mg BID -Continue IV Lasix, monitor renal function closely.  LBBB -No previous records, but this appears to be chronic. -Patient has atrial fibrillation and with the aberrant conduction at rapid rates this looks like ventricular tachycardia.   HTN and CAD:  Remote history in Dr. Arlina Robes notes of abnormal stress, decreased LVF, but recent echo with pEF. Not on statin. -Diltiazem gtt-changed to amio  Hyponatremia:  In setting of Afib and resulting CHF.   Anxiety:  -May continue lorazepam PRN  Leukocytosis -?reactive -no fever  Place in SDU until after cardioversion as respiratory status tenuous  Code Status: DNR Family Communication: patient Disposition Plan:    Consultants:  Cardiology  Procedures:    Objective: Filed Vitals:   01/11/16 0551 01/11/16 0727  BP:  131/101  Pulse:  116  Temp: 99.4 F (37.4 C) 96.8 F (36 C)  Resp:  21    Intake/Output Summary (Last 24 hours) at 01/11/16 0953 Last data filed at 01/11/16 0700  Gross per 24 hour  Intake   1203 ml    Output    725 ml  Net    478 ml   Filed Weights   01/09/16 0502 01/10/16 0505 01/11/16 0500  Weight: 54.522 kg (120 lb 3.2 oz) 57.2 kg (126 lb 1.7 oz) 56 kg (123 lb 7.3 oz)    Exam:   General:  Awake, NAD  Cardiovascular: irr and fast  Respiratory: crackles  Abdomen: +BS, soft  Musculoskeletal: min edema   Data Reviewed: Basic Metabolic Panel:  Recent Labs Lab 01/07/16 0320 01/08/16 0435 01/09/16 0447 01/10/16 0519 01/10/16 1103  NA 138 136 139 133* 135  K 3.3* 3.3* 3.5 3.4* 4.3  CL 98* 102 100* 96* 103  CO2 31 26 27  19* 22  GLUCOSE 94 107* 98 338* 168*  BUN 14 10 8 17 17   CREATININE 0.67 0.56 0.53 1.07* 0.88  CALCIUM 9.1 8.7* 9.2 8.8* 8.6*  MG 2.3  --   --   --   --    Liver Function Tests: No results for input(s): AST, ALT, ALKPHOS, BILITOT, PROT, ALBUMIN in the last 168 hours. No results for input(s): LIPASE, AMYLASE in the last 168 hours. No results for input(s): AMMONIA in the last 168 hours. CBC:  Recent Labs Lab 01/08/16 0435 01/09/16 0447 01/10/16 0519 01/10/16 1103 01/11/16 0330  WBC 9.5 8.4 21.8* 17.1* 17.4*  NEUTROABS  --   --   --  15.1*  --   HGB 13.0 12.7 14.2 13.7 13.8  HCT 40.0 38.0 43.5 41.7 41.7  MCV 95.5 95.7 96.2 95.0 96.5  PLT 331 316 480* 379 446*   Cardiac Enzymes:  Recent Labs Lab 01/07/16 1256 01/07/16 1930 01/07/16 2315  TROPONINI <0.03 <0.03 <0.03   BNP (last 3 results)  Recent Labs  01/09/16 0447  BNP 94.1    ProBNP (last 3 results) No results for input(s): PROBNP in the last 8760 hours.  CBG:  Recent Labs Lab 01/10/16 0700  GLUCAP 191*    Recent Results (from the past 240 hour(s))  MRSA PCR Screening     Status: None   Collection Time: 01/06/16 10:25 PM  Result Value Ref Range Status   MRSA by PCR NEGATIVE NEGATIVE Final    Comment:        The GeneXpert MRSA Assay (FDA approved for NASAL specimens only), is one component of a comprehensive MRSA colonization surveillance program. It is  not intended to diagnose MRSA infection nor to guide or monitor treatment for MRSA infections.   MRSA PCR Screening     Status: None   Collection Time: 01/10/16 12:49 PM  Result Value Ref Range Status   MRSA by PCR NEGATIVE NEGATIVE Final    Comment:        The GeneXpert MRSA Assay (FDA approved for NASAL specimens only), is one component of a comprehensive MRSA colonization surveillance program. It is not intended to diagnose MRSA infection nor to guide or monitor treatment for MRSA infections.      Studies: Dg Chest Port 1 View  01/10/2016  CLINICAL DATA:  Shortness of breath. EXAM: PORTABLE CHEST 1 VIEW COMPARISON:  Radiographs 01/05/2016, CT 01/06/2016 FINDINGS: Heart  at the upper limits normal in size. Perihilar pulmonary edema has progressed from prior exam. Bilateral pleural effusions, likely increased on the right in the interim. No pneumothorax. Kyphoplasty noted in the upper thoracic spine. IMPRESSION: Progressive pulmonary edema and pleural effusions consistent with CHF. Electronically Signed   By: Jeb Levering M.D.   On: 01/10/2016 03:59    Scheduled Meds: . furosemide  40 mg Intravenous BID  . pantoprazole  40 mg Oral Daily   Continuous Infusions: . amiodarone 60 mg/hr (01/11/16 [id number])  . heparin 800 Units/hr (01/11/16 [id number])   Antibiotics Given (last 72 hours)    None      Principal Problem:   Atrial fibrillation with RVR (HCC) Active Problems:   Chronic diastolic CHF (congestive heart failure) (HCC)   Hyponatremia   Leukocytosis   Essential hypertension   Atherosclerosis of native coronary artery of native heart without angina pectoris   Acute on chronic combined systolic and diastolic congestive heart failure, NYHA class 3 (HCC)   SOB (shortness of breath)    Time spent: 25 min    [facility] Pager [phone]. If 7PM-7AM, please contact night-coverage at [email or web address], password [facility] 01/11/2016, 9:53 AM  LOS: 5 days

## 2016-01-11 NOTE — Anesthesia Procedure Notes (Signed)
Procedure Name: MAC Date/Time: 01/11/2016 1:32 PM Performed by: Kyung Rudd Pre-anesthesia Checklist: Patient identified, Emergency Drugs available, Suction available, Patient being monitored and Timeout performed Patient Re-evaluated:Patient Re-evaluated prior to inductionOxygen Delivery Method: Nasal cannula Intubation Type: IV induction Placement Confirmation: positive ETCO2

## 2016-01-11 NOTE — CV Procedure (Signed)
TEE/DCC:  Anesthesia propofol   EF 40-45% inferior hypokinesis Moderate MR No LAA thrombus No ASD  DCC x 1 120 J biphasic converted to NSR  After 10 minutes noted to be going in/out of PAF Amiodarone on and on Rx heparin  Will check ECG  Jenkins Rouge

## 2016-01-11 NOTE — Progress Notes (Deleted)
  Echocardiogram Echocardiogram Transesophageal has been performed.  [name] [name] 01/11/2016, 3:03 PM

## 2016-01-11 NOTE — Plan of Care (Signed)
Problem: Safety: Goal: Ability to remain free from injury will improve Outcome: Progressing Pt understands to call for help when getting out of bed. No falls or injuries this shift. Patient educated about fall prevention. Pt verbalized understanding.    

## 2016-01-12 ENCOUNTER — Encounter (HOSPITAL_COMMUNITY): Payer: Self-pay | Admitting: Cardiovascular Disease

## 2016-01-12 LAB — BASIC METABOLIC PANEL
ANION GAP: 13 (ref 5–15)
BUN: 32 mg/dL — ABNORMAL HIGH (ref 6–20)
CALCIUM: 8.6 mg/dL — AB (ref 8.9–10.3)
CO2: 20 mmol/L — AB (ref 22–32)
Chloride: 98 mmol/L — ABNORMAL LOW (ref 101–111)
Creatinine, Ser: 1.22 mg/dL — ABNORMAL HIGH (ref 0.44–1.00)
GFR calc Af Amer: 46 mL/min — ABNORMAL LOW (ref >60)
GFR calc non Af Amer: 40 mL/min — ABNORMAL LOW (ref >60)
GLUCOSE: 119 mg/dL — AB (ref 65–99)
POTASSIUM: 3.8 mmol/L (ref 3.5–5.1)
Sodium: 131 mmol/L — ABNORMAL LOW (ref 135–145)

## 2016-01-12 MED ORDER — METOPROLOL TARTRATE 25 MG PO TABS
25.0000 mg | ORAL_TABLET | Freq: Two times a day (BID) | ORAL | Status: DC
  Administered 2016-01-12 – 2016-01-23 (×22): 25 mg via ORAL
  Filled 2016-01-12 (×22): qty 1

## 2016-01-12 MED ORDER — METOPROLOL TARTRATE 12.5 MG HALF TABLET
12.5000 mg | ORAL_TABLET | Freq: Once | ORAL | Status: AC
  Administered 2016-01-12: 12.5 mg via ORAL
  Filled 2016-01-12: qty 1

## 2016-01-12 NOTE — Progress Notes (Signed)
Patient stated she does not wear BIPAP at home and does not wish to wear one here. Patient is resting on 4L [place] with O2 sat 96%. RT will continue to monitor as needed.

## 2016-01-12 NOTE — Consult Note (Signed)
ELECTROPHYSIOLOGY CONSULT NOTE    Patient ID: [name] [name] MRN: [id number], DOB/AGE: 1932/01/19 82 y.o.  Admit date: 01/06/2016 Date of Consult: 01/12/2016  Primary Physician: Glenda Chroman., MD Primary Cardiologist: Dr. Meda Coffee ([place]) (previously Dr. Norva Karvonen in 2008)  Reason for Consultation: Afib  HPI: [name] [name] is a 82 y.o. female who was admitted to [facility] 01/06/16 with recent/new onset atrial fibrillation.   She has history of chronic CHF (mixed), chronic LBBB and HTN, anxiety.    Records report that the patient initially presented to [facility] several weeks ago with a complaint of shortness of breath and a cough. She was treated for PNA, monitored overnight and discharged home. She presented back 1 week later with chest discomfort and weakness + elevated HR. She was found to be in new onset atrial fibrillation. She was treated at [facility] with an unknown  IV drip and was seen by a cardiologist at the time, who discussed cardioversion with her. She was discharged to a rehab facility with plans to f/u with cardiology at [facility]. Per records in [place], a consult was placed but no visit since discharge.   Over the past week, she has had worsening symptoms of weakness and dyspnea on exertion, prompting her to report back to [facility]. CT angiogram and chest x-ray suggested pulmonary edema without PE, and she was thought to have acute diastolic CHF. Cardiac enzymes were negative. The patient's family requested transfer to [facility] for further care.   She was on cardizem gtt with rates 110's and heparin gtt and planned for DCCV,, she was continued on IV lasix for CHF as well, unfortunately she had worsening of her CHF and were unable to proceed, she was switched to Amiodarone gtt in effort to better rate control her, undergoing DCCV 01/11/16 with fairly quick return to AFib, she remains on amiodarone gtt and Eliquis, the Heparin discontinued.  She was historically seen  by cardiology, 2008, with LVEF 40%, and records discuss stress echo back then negative for ischemia.  Discussed with the patient's daughter (at the patient's request), she has no known CAD/MI history.  She relates she has been  Very independent until about 2 months ago, and increased anxiety she noted her BP very high at home and went to the ER, while there apparently incidentally found with the PNA, and treated.  She had been home and noted ongoing anxiety particularly and developed palpitations noting her HR 130's at home and returned to the ER.  There she apparently spent a few days in the ICU on IV gtt and eventually discharged to a rehab facitlity.  She went home, and reportedly never really feeling much better.  She went back to the ER and at this time, her daughter insisted she be transferred to [facility].    IM note rports she had CT chest done at [facility] the night prior to transfer negative for PE, + CHF  At this time she denies any kind of CP, generally feel weak, no overt SOB at this time.   Past Medical History  Diagnosis Date  . Osteoporosis   . Hypertension   . Colon cancer ([place])     1980  . Pneumonia 12/2015  . GERD (gastroesophageal reflux disease)   . Osteoarthritis     "all over"  . Anxiety      Surgical History:  Past Surgical History  Procedure Laterality Date  . Colectomy  1980    "cancer"  . Rectal surgery  1998    "  enlarged rectum; cut out some hemorrhoids"  . Cataract extraction w/ intraocular lens  implant, bilateral Bilateral 2010  . Knee arthroscopy Right 1999  . Hand nerve repair Right 1983    "fell on a bowl and hit right on my wrist; cut nerves/tendons"  . Tonsillectomy  1943  . Laparoscopic cholecystectomy  early 2000s  . Vaginal hysterectomy  1966?  Marland Kitchen Tee without cardioversion N/A 01/11/2016    Procedure: TRANSESOPHAGEAL ECHOCARDIOGRAM (TEE);  Surgeon: Josue Hector, MD;  Location: [facility];  Service: Cardiovascular;  Laterality: N/A;  . Cardioversion  N/A 01/11/2016    Procedure: CARDIOVERSION;  Surgeon: Josue Hector, MD;  Location: [facility] ENDOSCOPY;  Service: Cardiovascular;  Laterality: N/A;     Prescriptions prior to admission  Medication Sig Dispense Refill Last Dose  . bisoprolol-hydrochlorothiazide (ZIAC) 2.5-6.25 MG per tablet Take 2 tablets by mouth daily.   01/06/2016 at Unknown time  . calcium-vitamin D (OSCAL WITH D) 500-200 MG-UNIT per tablet Take 1 tablet by mouth daily.   01/06/2016 at Unknown time  . Cholecalciferol (VITAMIN D-3 PO) Take 2,000 mg by mouth daily.   01/06/2016 at Unknown time  . HYDROcodone-acetaminophen (NORCO) 7.5-325 MG tablet Take 1 tablet by mouth every 6 (six) hours as needed for moderate pain.   Past Week at Unknown time  . lisinopril (PRINIVIL,ZESTRIL) 10 MG tablet Take 10 mg by mouth 2 (two) times daily.   01/06/2016 at Unknown time  . LORazepam (ATIVAN) 1 MG tablet Take 0.5-1 mg by mouth every 8 (eight) hours as needed for anxiety.    Past Week at Unknown time  . omeprazole (PRILOSEC) 20 MG capsule Take 20 mg by mouth daily.   01/06/2016 at Unknown time  . polyethylene glycol (MIRALAX / GLYCOLAX) packet Take 17 g by mouth daily as needed for mild constipation. One tablespoon a day   Past Month at Unknown time  . vitamin E (VITAMIN E) 400 UNIT capsule Take 400 Units by mouth daily.   01/06/2016 at Unknown time  . [DISCONTINUED] ciprofloxacin (CIPRO) 500 MG tablet Take 500 mg by mouth 2 (two) times daily.   08/19/2015 at Unknown time  . [DISCONTINUED] gabapentin (NEURONTIN) 300 MG capsule Take 1 capsule (300 mg total) by mouth at bedtime. 30 capsule 0   . [DISCONTINUED] HYDROcodone-acetaminophen (NORCO) 7.5-325 MG per tablet Take 1 tablet by mouth every 12 (twelve) hours.   08/20/2015 at Unknown time    Inpatient Medications:  . apixaban  2.5 mg Oral Q12H  . pantoprazole  40 mg Oral Daily    Allergies:  Allergies  Allergen Reactions  . Penicillins Other (See Comments)  Sulfa drugs  Social History   Social  History  . Marital Status: Divorced    Spouse Name: N/A  . Number of Children: N/A  . Years of Education: N/A   Occupational History  . Not on file.   Social History Main Topics  . Smoking status: Never Smoker   . Smokeless tobacco: Never Used  . Alcohol Use: No  . Drug Use: No  . Sexual Activity: No   Other Topics Concern  . Not on file   Social History Narrative     Family History  Problem Relation Age of Onset  . Congestive Heart Failure Mother   . Prostate cancer Father   . Hypertension Father      Review of Systems: All other systems reviewed and are otherwise negative except as noted above.  Physical Exam: Filed Vitals:   01/11/16 2000  01/11/16 2321 01/12/16 0436 01/12/16 0816  BP: 88/78 116/78 113/78 103/86  Pulse:  94 84 132  Temp:  99 F (37.2 C) 98.9 F (37.2 C) 97.5 F (36.4 C)  TempSrc:  Axillary Axillary Oral  Resp: 21 31 18 20   Height:      Weight:   127 lb 6.8 oz (57.8 kg)   SpO2:  95% 95% 98%    GEN- The patient is an elderly female, appears in NAD, alert and oriented x 3 today.   HEENT: normocephalic, atraumatic; sclera clear, conjunctiva pink; hearing intact; oropharynx clear; neck supple, no JVP Lymph- no cervical lymphadenopathy Lungs- Clear to ausculation bilaterally, normal work of breathing.  No wheezes, rales, rhonchi Heart- irregular rate and rhythm,tachycardic, no murmurs, rubs or gallops, PMI not laterally displaced GI- soft, non-tender, non-distended Extremities- no clubbing, cyanosis, or edema MS- no significant deformity or atrophy Skin- warm and dry, no rash or lesion Psych- euthymic mood, full affect Neuro- no gross deficits observed  Labs:   Lab Results  Component Value Date   WBC 17.4* 01/11/2016   HGB 13.8 01/11/2016   HCT 41.7 01/11/2016   MCV 96.5 01/11/2016   PLT 446* 01/11/2016    Recent Labs Lab 01/12/16 0300  NA 131*  K 3.8  CL 98*  CO2 20*  BUN 32*  CREATININE 1.22*  CALCIUM 8.6*  GLUCOSE 119*       Radiology/Studies:  Dg Chest Port 1 View 01/10/2016  CLINICAL DATA:  Shortness of breath. EXAM: PORTABLE CHEST 1 VIEW COMPARISON:  Radiographs 01/05/2016, CT 01/06/2016 FINDINGS: Heart at the upper limits normal in size. Perihilar pulmonary edema has progressed from prior exam. Bilateral pleural effusions, likely increased on the right in the interim. No pneumothorax. Kyphoplasty noted in the upper thoracic spine. IMPRESSION: Progressive pulmonary edema and pleural effusions consistent with CHF. Electronically Signed   By: Jeb Levering M.D.   On: 01/10/2016 03:59    EKG: AFib, LAD, LBBB, 109bpm   TELEMETRY: AFib, currently 130's  01/11/16: TEE/DCCV Study Conclusions - Left ventricle: Systolic function was mildly to moderately reduced. The estimated ejection fraction was in the range of 40% to 45%. - Mitral valve: There was moderate regurgitation. - Left atrium: The atrium was dilated. No evidence of thrombus in the atrial cavity or appendage. - Right atrium: The atrium was dilated. - Tricuspid valve: There was moderate regurgitation. - Impressions: No LAAA DCC followed with single `120 J biphasic shock converted to NSR no immediate neurologic seqelae.  01/09/16: Echocardiogram Study Conclusions - Left ventricle: LVEF is approximately 35 to 40% with akinesis of the inferior/inferoseptal walls. The cavity size was normal. Wall thickness was increased in a pattern of moderate LVH. - Mitral valve: There was moderate regurgitation. - Left atrium: The atrium was mildly dilated. - Pulmonary arteries: PA peak pressure: 45 mm Hg (S). LA 34mm  Assessment and Plan:   1. New onset atrial fibrillation     CHADS2vasc is at least 5 on Eliquis     Rate uncontrolled     TSH is wnl     Amio gtt started 01/09/16, DCCV 01/11/16 converted to SR with fairly quick return to AF about 6 this morning     BP appears like it would tolerate add on BB to aid in rate control  2. CHF      EF 40-45% (chronically)     Negative troponins     Now off diuretic felt to be dry now by cards service  3.  HTN    Signed, Jodelle Green 01/12/2016 12:15 PM  EP Attending  Patient seen and examined. CV - IRIR tachy, Lungs with scattered rales and extremities with trace edema. ECG demonstrates fairly regular atrial fib with LBBB. A/P 1. Recurrent atrial fibrillation - she has had a recurrence of her atrial fib early after DCCV. Her rate is not well controlled. Her treatment options are difficult. I would suggest she be continued on IV amiodarone for several more days and undergo repeat DCCV on Monday. She will be given additional beta blockers as tolerated for rate control.  2. Acute on chronic systolic/diastolic heart failure - she is class 2 and currently nearly euvolemic. Will use IV lasix as needed for volume overload. She is likely a little on dry side at present. 3. HTN heart disease - her blood pressure is stable. Will follow.   Champ Mungo. Skylynne Schlechter,M.D.

## 2016-01-12 NOTE — Progress Notes (Signed)
EKG done and placed on chart. Noted Afib rate; RVR 124.

## 2016-01-12 NOTE — Progress Notes (Signed)
Daughter here to speak with Case Manager regarding patient medications.

## 2016-01-12 NOTE — Progress Notes (Addendum)
     SUBJECTIVE: weakness, no pain  Tele: atrial fib, rvr  BP 103/86 mmHg  Pulse 132  Temp(Src) 97.5 F (36.4 C) (Oral)  Resp 20  Ht 5' (1.524 m)  Wt 127 lb 6.8 oz (57.8 kg)  BMI 24.89 kg/m2  SpO2 98%  Intake/Output Summary (Last 24 hours) at 01/12/16 1028 Last data filed at 01/12/16 0830  Gross per 24 hour  Intake 1203.28 ml  Output   1275 ml  Net -71.72 ml    PHYSICAL EXAM General: Thin elderly female in NAD. Alert and oriented x 3.  Psych:  Good affect, responds appropriately Neck: No JVD. No masses noted.  Lungs: Clear bilaterally with no wheezes or rhonci noted.  Heart: irreg irreg with no murmurs noted. Abdomen: Bowel sounds are present. Soft, non-tender.  Extremities: No lower extremity edema.   LABS: Basic Metabolic Panel:  Recent Labs  01/10/16 1103 01/12/16 0300  NA 135 131*  K 4.3 3.8  CL 103 98*  CO2 22 20*  GLUCOSE 168* 119*  BUN 17 32*  CREATININE 0.88 1.22*  CALCIUM 8.6* 8.6*   CBC:  Recent Labs  01/10/16 1103 01/11/16 0330  WBC 17.1* 17.4*  NEUTROABS 15.1*  --   HGB 13.7 13.8  HCT 41.7 41.7  MCV 95.0 96.5  PLT 379 446*   Current Meds: . apixaban  2.5 mg Oral Q12H  . furosemide  40 mg Intravenous BID  . pantoprazole  40 mg Oral Daily    ASSESSMENT AND PLAN:  1. Atrial fibrillation with RVR: She was cardioverted to sinus yesterday but quickly reverted to atrial fib. She is now in the 130ss on amiodarone drip. She has been on admiodarone since Monday. Tough situation. I think her atrial fib is likely contributing to her CHF. Will continue IV amiodarone today but she has been adequately loaded. Not sure that repeat DCCV would be effective.  Continue Eliquis. Will ask EP team to see today to assist with options for long term management/rate control of her atrial fibrillation.    2. Acute on Chronic systolic and diastolic CHF: Respiratory status better on Lasix. LVEF is 40% on echo. BUN/creat bump today. Would hold Lasix today and  then based on volume status/labs restart po Lasix tomorrow.     3. Hypokalemia: Resolved with replacement.    4. LBBB: chronic  Rasheka Denard  2/2/201710:28 AM

## 2016-01-12 NOTE — Progress Notes (Signed)
Dr. Hartford Poli on floor for rounds and advised of Afib rate 120's. Pt continues to be on Amiodarone infusion as ordered. Systolic pressure 123XX123.

## 2016-01-12 NOTE — Progress Notes (Signed)
PROGRESS NOTE  [name] [name] [id number] DOB: Sep 06, 1932 DOA: 01/06/2016 PCP: Glenda Chroman., MD   Subjective: Denies any chest pain, heart rate 130s, DC CV tried yesterday but patient was back quickly to atrial fibrillation  HPI: [name] [name] is a 82 y.o. female with a past medical history significant for HTN and chronic systolic and diastolic CHF, but last EF 55% who presents with SOB and afib.  The patient was in her usual state of health until the beginning of this month when she developed dyspnea and cough, was observed overnight at [facility], treated for pneumonia and discharged. A week later, she developed chest discomfort and weakness, checked her HR on her BP cuff, found it to be elevated, went back to the hospital and was diagnosed with new onset Afib. She reports being "on a drip in the ICU" for five days, then converted to an oral agent (she thinks for HR control), seen by a Cardiologist who discussed cardioversion with her, and then being discharged to a rehab facility about a week ago.  Now, over the last week, she has had progressive weakness and shortness of breath when working with PT at rehabilitation. At night she also describes spells of vague chest discomfort that are intermittent and resolve with lorazepam. She has not had leg swelling, orthopnea, paroxysmal nocturnal dyspnea, frank chest pain, palpitations, or fever, chills, cough, dyspnea at rest, rigors, or sputum.  She was admitted to [facility] last night after a CT angiogram and chest x-ray suggested pulmonary edema without PE, and she was thought to have acute diastolic CHF. She ruled out with serial troponins overnight. The patient's family requested transfer to [facility] because they were hoping to see new doctors.    Assessment/Plan: Principal Problem:   Atrial fibrillation with RVR (HCC) Active Problems:   Chronic diastolic CHF (congestive heart failure) (HCC)   Hyponatremia  Leukocytosis   Essential hypertension   Atherosclerosis of native coronary artery of native heart without angina pectoris   Acute on chronic combined systolic and diastolic congestive heart failure, NYHA class 3 (HCC)   SOB (shortness of breath)   Afib with RVR and aberrant conduction:  -New? The patient has no history of Afib until earlier this month. CHADs2Vasc 5 (CHF, HTN, Age, sex). She claims she was on a drip for 5 days and then converted to an oral agent for HR control, but the only nodal blocker I see she is on at admission to [facility] is bisoprolol 2.5 mg.  -Diltiazem changed to amiodarone but still not controlled -Hold bispoprolol/HCTZ and ACEi for now -Consult to Cardiology, appreciate care -Was on heparin GTT, switched to Eliquis -TSH ok -DCCV to be done yesterday, brief period of sinus rhythm back to atrial fibrillation. -EP cardiology to evaluate  Acute renal failure Creatinine increased from 0.8 up to 1.2, this is likely secondary to diuresis. Hold Lasix today.   Chronic diastolic CHF:  -Old history of ischemic CM. Recent echo with EF 55%. -IV Lasix 40 mg BID -Continue IV Lasix, monitor renal function closely.  LBBB -No previous records, but this appears to be chronic. -Patient has atrial fibrillation and with the aberrant conduction at rapid rates this looks like ventricular tachycardia.   HTN and CAD:  Remote history in Dr. Arlina Robes notes of abnormal stress, decreased LVF, but recent echo with pEF. Not on statin. -Diltiazem gtt-changed to amio  Hyponatremia:  In setting of Afib and resulting CHF.   Anxiety:  -May continue lorazepam PRN  Leukocytosis -?  reactive -no fever  Place in SDU until after cardioversion as respiratory status tenuous  Code Status: DNR Family Communication: patient Disposition Plan:    Consultants:  Cardiology  Procedures:    Objective: Filed Vitals:   01/12/16 0436 01/12/16 0816  BP: 113/78 103/86    Pulse: 84 132  Temp: 98.9 F (37.2 C) 97.5 F (36.4 C)  Resp: 18 20    Intake/Output Summary (Last 24 hours) at 01/12/16 1201 Last data filed at 01/12/16 1000  Gross per 24 hour  Intake 1496.68 ml  Output   1275 ml  Net 221.68 ml   Filed Weights   01/10/16 0505 01/11/16 0500 01/12/16 0436  Weight: 57.2 kg (126 lb 1.7 oz) 56 kg (123 lb 7.3 oz) 57.8 kg (127 lb 6.8 oz)    Exam:   General:  Awake, NAD  Cardiovascular: irr and fast  Respiratory: crackles  Abdomen: +BS, soft  Musculoskeletal: min edema   Data Reviewed: Basic Metabolic Panel:  Recent Labs Lab 01/07/16 0320 01/08/16 0435 01/09/16 0447 01/10/16 0519 01/10/16 1103 01/12/16 0300  NA 138 136 139 133* 135 131*  K 3.3* 3.3* 3.5 3.4* 4.3 3.8  CL 98* 102 100* 96* 103 98*  CO2 31 26 27  19* 22 20*  GLUCOSE 94 107* 98 338* 168* 119*  BUN 14 10 8 17 17  32*  CREATININE 0.67 0.56 0.53 1.07* 0.88 1.22*  CALCIUM 9.1 8.7* 9.2 8.8* 8.6* 8.6*  MG 2.3  --   --   --   --   --    Liver Function Tests: No results for input(s): AST, ALT, ALKPHOS, BILITOT, PROT, ALBUMIN in the last 168 hours. No results for input(s): LIPASE, AMYLASE in the last 168 hours. No results for input(s): AMMONIA in the last 168 hours. CBC:  Recent Labs Lab 01/08/16 0435 01/09/16 0447 01/10/16 0519 01/10/16 1103 01/11/16 0330  WBC 9.5 8.4 21.8* 17.1* 17.4*  NEUTROABS  --   --   --  15.1*  --   HGB 13.0 12.7 14.2 13.7 13.8  HCT 40.0 38.0 43.5 41.7 41.7  MCV 95.5 95.7 96.2 95.0 96.5  PLT 331 316 480* 379 446*   Cardiac Enzymes:  Recent Labs Lab 01/07/16 1256 01/07/16 1930 01/07/16 2315  TROPONINI <0.03 <0.03 <0.03   BNP (last 3 results)  Recent Labs  01/09/16 0447  BNP 94.1    ProBNP (last 3 results) No results for input(s): PROBNP in the last 8760 hours.  CBG:  Recent Labs Lab 01/10/16 0700  GLUCAP 191*    Recent Results (from the past 240 hour(s))  MRSA PCR Screening     Status: None   Collection Time:  01/06/16 10:25 PM  Result Value Ref Range Status   MRSA by PCR NEGATIVE NEGATIVE Final    Comment:        The GeneXpert MRSA Assay (FDA approved for NASAL specimens only), is one component of a comprehensive MRSA colonization surveillance program. It is not intended to diagnose MRSA infection nor to guide or monitor treatment for MRSA infections.   MRSA PCR Screening     Status: None   Collection Time: 01/10/16 12:49 PM  Result Value Ref Range Status   MRSA by PCR NEGATIVE NEGATIVE Final    Comment:        The GeneXpert MRSA Assay (FDA approved for NASAL specimens only), is one component of a comprehensive MRSA colonization surveillance program. It is not intended to diagnose MRSA infection nor to guide or monitor  treatment for MRSA infections.      Studies: No results found.  Scheduled Meds: . apixaban  2.5 mg Oral Q12H  . pantoprazole  40 mg Oral Daily   Continuous Infusions: . amiodarone 60 mg/hr (01/12/16 0042)  . lactated ringers 10 mL/hr at 01/11/16 1721   Antibiotics Given (last 72 hours)    None      Principal Problem:   Atrial fibrillation with RVR (HCC) Active Problems:   Chronic diastolic CHF (congestive heart failure) (HCC)   Hyponatremia   Leukocytosis   Essential hypertension   Atherosclerosis of native coronary artery of native heart without angina pectoris   Acute on chronic combined systolic and diastolic congestive heart failure, NYHA class 3 (HCC)   SOB (shortness of breath)    Time spent: 25 min    [facility] Pager [phone]. If 7PM-7AM, please contact night-coverage at [email or web address], password [facility] 01/12/2016, 12:01 PM  LOS: 6 days

## 2016-01-12 NOTE — Care Management Note (Signed)
Case Management Note  Patient Details  Name: [name] [name] MRN: [id number] Date of Birth: 1932/07/11  Subjective/Objective:  Pt lives alone, dtr lives next door.  Pt started on Eliquis provided card for 30-day free trial.  Pt has no prescription coverage, does not qualify for copay card because she has Medicare A and B.  Provided dtr with information for patient assistance program, she completed application and received verification that pt will be able to purchase Eliquis for $35/month.                                 Expected Discharge Plan:  [place]  In-House Referral:  Clinical Social Work  Discharge planning Services  CM Consult, Medication Assistance  Status of Service:  In process, will continue to follow  Medicare Important Message Given:  Yes  Girard Cooter, RN 01/12/2016, 1:17 PM

## 2016-01-13 DIAGNOSIS — I5032 Chronic diastolic (congestive) heart failure: Secondary | ICD-10-CM

## 2016-01-13 LAB — BASIC METABOLIC PANEL
ANION GAP: 16 — AB (ref 5–15)
BUN: 32 mg/dL — ABNORMAL HIGH (ref 6–20)
CALCIUM: 9 mg/dL (ref 8.9–10.3)
CO2: 22 mmol/L (ref 22–32)
Chloride: 91 mmol/L — ABNORMAL LOW (ref 101–111)
Creatinine, Ser: 0.98 mg/dL (ref 0.44–1.00)
GFR, EST NON AFRICAN AMERICAN: 52 mL/min — AB (ref >60)
GLUCOSE: 128 mg/dL — AB (ref 65–99)
POTASSIUM: 4.3 mmol/L (ref 3.5–5.1)
SODIUM: 129 mmol/L — AB (ref 135–145)

## 2016-01-13 MED ORDER — FUROSEMIDE 40 MG PO TABS
40.0000 mg | ORAL_TABLET | Freq: Every day | ORAL | Status: DC
  Administered 2016-01-13 – 2016-01-15 (×3): 40 mg via ORAL
  Filled 2016-01-13 (×3): qty 1

## 2016-01-13 NOTE — Progress Notes (Signed)
    SUBJECTIVE: The patient is resting comfortably when I enter, easily woken, she states she is feeling better  today.  At this time, she denies chest pain, shortness of breath, or any new concerns.  Marland Kitchen apixaban  2.5 mg Oral Q12H  . furosemide  40 mg Oral Daily  . metoprolol tartrate  25 mg Oral BID  . pantoprazole  40 mg Oral Daily   . amiodarone 60 mg/hr (01/13/16 0420)  . lactated ringers 10 mL/hr at 01/11/16 1721    OBJECTIVE: Physical Exam: Filed Vitals:   01/12/16 2335 01/13/16 0400 01/13/16 0427 01/13/16 0828  BP: 107/84 105/84  101/82  Pulse: 123 104  115  Temp: 97.5 F (36.4 C) 98.8 F (37.1 C)  97.6 F (36.4 C)  TempSrc: Axillary Axillary  Axillary  Resp: 24 24  19   Height:      Weight:   128 lb 1.4 oz (58.1 kg)   SpO2: 97% 99%  99%    Intake/Output Summary (Last 24 hours) at 01/13/16 1004 Last data filed at 01/13/16 0600  Gross per 24 hour  Intake 1192.6 ml  Output    850 ml  Net  342.6 ml    Telemetry reveals: appears still in AF, rates about 100-110  GEN- The patient is thin, frail appearing, alert and oriented x 3 today.   Head- normocephalic, atraumatic Eyes-  Sclera clear, conjunctiva pink Ears- hearing intact Oropharynx- clear Neck- supple, no JVP Lungs- some soft scatterd rales bilaterally, normal work of breathing Heart- irregular rate and rhythm, no significant murmurs, no rubs or gallops GI- soft, NT, ND Extremities- no clubbing, cyanosis, or edema Skin- no rash or lesion Psych- euthymic mood, full affect Neuro- no gross deficits appreciated  LABS: Basic Metabolic Panel:  Recent Labs  01/12/16 0300 01/13/16 0340  NA 131* 129*  K 3.8 4.3  CL 98* 91*  CO2 20* 22  GLUCOSE 119* 128*  BUN 32* 32*  CREATININE 1.22* 0.98  CALCIUM 8.6* 9.0   CBC:  Recent Labs  01/10/16 1103 01/11/16 0330  WBC 17.1* 17.4*  NEUTROABS 15.1*  --   HGB 13.7 13.8  HCT 41.7 41.7  MCV 95.0 96.5  PLT 379 446*    ASSESSMENT AND PLAN:  1. New  AFib, RVR     Early RAF s/p DCCV 01/11/16     Rate is improving     BP tolerating the addition of BB     Plan is to continue IV amiodarone and try DCCV again Monday if not converted   2. CHF     Diuresis as clinically indicated     3. HTN     BP stable         Tommye Standard, PA-C 01/13/2016 10:04 AM   I have seen and examined this patient with Tommye Standard.  Agree with above, note added to reflect my findings.  On exam, tachycardic, irregular, lungs clear, no murmurs.  Has new onset AF with RVR, had attempt at cardioversion, now back in AF.  Plan for amiodarone over the weekend then repeat cardioversion Monday if necessary.    Howie Rufus M. Giabella Duhart MD 01/13/2016 6:27 PM

## 2016-01-13 NOTE — Progress Notes (Signed)
     SUBJECTIVE: No chest pain or SOB. Feels weak.   BP 101/82 mmHg  Pulse 115  Temp(Src) 97.6 F (36.4 C) (Axillary)  Resp 19  Ht 5' (1.524 m)  Wt 128 lb 1.4 oz (58.1 kg)  BMI 25.02 kg/m2  SpO2 99%  Intake/Output Summary (Last 24 hours) at 01/13/16 0940 Last data filed at 01/13/16 0600  Gross per 24 hour  Intake 1552.6 ml  Output    850 ml  Net  702.6 ml    PHYSICAL EXAM General: Frail elderly female. Alert and oriented x 3.  Psych:  Good affect, responds appropriately Neck: No JVD. No masses noted.  Lungs: Clear bilaterally with no wheezes or rhonci noted.  Heart: irregular irregular with no murmurs noted. Abdomen: Bowel sounds are present. Soft, non-tender.  Extremities: No lower extremity edema.   LABS: Basic Metabolic Panel:  Recent Labs  01/12/16 0300 01/13/16 0340  NA 131* 129*  K 3.8 4.3  CL 98* 91*  CO2 20* 22  GLUCOSE 119* 128*  BUN 32* 32*  CREATININE 1.22* 0.98  CALCIUM 8.6* 9.0   CBC:  Recent Labs  01/10/16 1103 01/11/16 0330  WBC 17.1* 17.4*  NEUTROABS 15.1*  --   HGB 13.7 13.8  HCT 41.7 41.7  MCV 95.0 96.5  PLT 379 446*    Current Meds: . apixaban  2.5 mg Oral Q12H  . metoprolol tartrate  25 mg Oral BID  . pantoprazole  40 mg Oral Daily    ASSESSMENT AND PLAN:  1. Atrial fibrillation with RVR: She was cardioverted to sinus 01/11/16 but quickly reverted to atrial fib. HR still not well controlled on IC amiodarone. She has been on admiodarone for 5 days now. Tough situation. I think her atrial fib is likely contributing to her CHF. Evaluated by the EP team yesterday and beta blocker added to IV amiodarone. Plans to continue IV amiodarone through the weekend and attempt DCCV again on Monday. Continue Eliquis.   2. Acute on Chronic systolic and diastolic CHF: Would resume po Lasix today. LVEF is 40% on echo. Creatinine has normalized.   3. Hypokalemia: Resolved with replacement.    4. LBBB:  chronic   MCALHANY,CHRISTOPHER  2/3/20179:40 AM

## 2016-01-13 NOTE — Care Management Important Message (Signed)
Important Message  Patient Details  Name: [name] [name] MRN: [id number] Date of Birth: 1932-02-04   Medicare Important Message Given:  Yes    Nathen May 01/13/2016, 11:42 AM

## 2016-01-13 NOTE — Progress Notes (Signed)
PROGRESS NOTE  BURNA HEUTON [id number] DOB: [birth year] DOA: 01/06/2016 PCP: Glenda Chroman., MD   Subjective: Denies any SOB or chest pain. Seen by EP cardiology, recommended to attempt DCCV again on Monday.  HPI: [name] [name] is a 82 y.o. female with a past medical history significant for HTN and chronic systolic and diastolic CHF, but last EF 55% who presents with SOB and afib.  The patient was in her usual state of health until the beginning of this month when she developed dyspnea and cough, was observed overnight at [facility], treated for pneumonia and discharged. A week later, she developed chest discomfort and weakness, checked her HR on her BP cuff, found it to be elevated, went back to the hospital and was diagnosed with new onset Afib. She reports being "on a drip in the ICU" for five days, then converted to an oral agent (she thinks for HR control), seen by a Cardiologist who discussed cardioversion with her, and then being discharged to a rehab facility about a week ago.  Now, over the last week, she has had progressive weakness and shortness of breath when working with PT at rehabilitation. At night she also describes spells of vague chest discomfort that are intermittent and resolve with lorazepam. She has not had leg swelling, orthopnea, paroxysmal nocturnal dyspnea, frank chest pain, palpitations, or fever, chills, cough, dyspnea at rest, rigors, or sputum.  She was admitted to [facility] last night after a CT angiogram and chest x-ray suggested pulmonary edema without PE, and she was thought to have acute diastolic CHF. She ruled out with serial troponins overnight. The patient's family requested transfer to [facility] because they were hoping to see new doctors.    Assessment/Plan: Principal Problem:   Atrial fibrillation with RVR (HCC) Active Problems:   Chronic diastolic CHF (congestive heart failure) (HCC)   Hyponatremia   Leukocytosis  Essential hypertension   Atherosclerosis of native coronary artery of native heart without angina pectoris   Acute on chronic combined systolic and diastolic congestive heart failure, NYHA class 3 (HCC)   SOB (shortness of breath)   Afib with RVR and aberrant conduction:  -New? The patient has no history of Afib until earlier this month. CHADs2Vasc 5 (CHF, HTN, Age, sex). She claims she was on a drip for 5 days and then converted to an oral agent for HR control, but the only nodal blocker I see she is on at admission to [facility] is bisoprolol 2.5 mg.  -Diltiazem changed to amiodarone but still not controlled -Hold bispoprolol/HCTZ and ACEi for now -Consult to Cardiology, appreciate care -Was on heparin GTT, switched to Eliquis -TSH ok -DCCV done on 01/11/16, brief period of sinus rhythm back to atrial fibrillation. -EP recommended to continue amiodarone drip, add beta blockers and attempt DC CV on Monday  Acute renal failure Creatinine increased from 0.8 up to 1.2, this is likely secondary to diuresis. Creatinine back to 0.98 after Lasix held. Lasix as needed, patient appears to be euvolemic.  Chronic diastolic CHF:  -Old history of ischemic CM. Recent echo with EF 55%. -IV Lasix 40 mg BID -Change Lasix to as needed.  LBBB -No previous records, but this appears to be chronic.   HTN and CAD:  Remote history in Dr. Arlina Robes notes of abnormal stress, decreased LVF, but recent echo with pEF. Not on statin. -Diltiazem gtt-changed to amio  Hyponatremia:  In setting of Afib and resulting CHF.   Anxiety:  -May continue lorazepam PRN  Leukocytosis -?  reactive -no fever  Place in SDU until after cardioversion as respiratory status tenuous  Code Status: DNR Family Communication: patient Disposition Plan:    Consultants:  Cardiology  Procedures:    Objective: Filed Vitals:   01/13/16 0828 01/13/16 1049  BP: 101/82 106/85  Pulse: 115 114  Temp: 97.6 F  (36.4 C)   Resp: 19     Intake/Output Summary (Last 24 hours) at 01/13/16 1104 Last data filed at 01/13/16 0600  Gross per 24 hour  Intake 1192.6 ml  Output    850 ml  Net  342.6 ml   Filed Weights   01/11/16 0500 01/12/16 0436 01/13/16 0427  Weight: 56 kg (123 lb 7.3 oz) 57.8 kg (127 lb 6.8 oz) 58.1 kg (128 lb 1.4 oz)    Exam:   General:  Awake, NAD  Cardiovascular: irr and fast  Respiratory: crackles  Abdomen: +BS, soft  Musculoskeletal: min edema   Data Reviewed: Basic Metabolic Panel:  Recent Labs Lab 01/07/16 0320  01/09/16 0447 01/10/16 0519 01/10/16 1103 01/12/16 0300 01/13/16 0340  NA 138  < > 139 133* 135 131* 129*  K 3.3*  < > 3.5 3.4* 4.3 3.8 4.3  CL 98*  < > 100* 96* 103 98* 91*  CO2 31  < > 27 19* 22 20* 22  GLUCOSE 94  < > 98 338* 168* 119* 128*  BUN 14  < > 8 17 17  32* 32*  CREATININE 0.67  < > 0.53 1.07* 0.88 1.22* 0.98  CALCIUM 9.1  < > 9.2 8.8* 8.6* 8.6* 9.0  MG 2.3  --   --   --   --   --   --   < > = values in this interval not displayed. Liver Function Tests: No results for input(s): AST, ALT, ALKPHOS, BILITOT, PROT, ALBUMIN in the last 168 hours. No results for input(s): LIPASE, AMYLASE in the last 168 hours. No results for input(s): AMMONIA in the last 168 hours. CBC:  Recent Labs Lab 01/08/16 0435 01/09/16 0447 01/10/16 0519 01/10/16 1103 01/11/16 0330  WBC 9.5 8.4 21.8* 17.1* 17.4*  NEUTROABS  --   --   --  15.1*  --   HGB 13.0 12.7 14.2 13.7 13.8  HCT 40.0 38.0 43.5 41.7 41.7  MCV 95.5 95.7 96.2 95.0 96.5  PLT 331 316 480* 379 446*   Cardiac Enzymes:  Recent Labs Lab 01/07/16 1256 01/07/16 1930 01/07/16 2315  TROPONINI <0.03 <0.03 <0.03   BNP (last 3 results)  Recent Labs  01/09/16 0447  BNP 94.1    ProBNP (last 3 results) No results for input(s): PROBNP in the last 8760 hours.  CBG:  Recent Labs Lab 01/10/16 0700  GLUCAP 191*    Recent Results (from the past 240 hour(s))  MRSA PCR Screening      Status: None   Collection Time: 01/06/16 10:25 PM  Result Value Ref Range Status   MRSA by PCR NEGATIVE NEGATIVE Final    Comment:        The GeneXpert MRSA Assay (FDA approved for NASAL specimens only), is one component of a comprehensive MRSA colonization surveillance program. It is not intended to diagnose MRSA infection nor to guide or monitor treatment for MRSA infections.   MRSA PCR Screening     Status: None   Collection Time: 01/10/16 12:49 PM  Result Value Ref Range Status   MRSA by PCR NEGATIVE NEGATIVE Final    Comment:  The GeneXpert MRSA Assay (FDA approved for NASAL specimens only), is one component of a comprehensive MRSA colonization surveillance program. It is not intended to diagnose MRSA infection nor to guide or monitor treatment for MRSA infections.      Studies: No results found.  Scheduled Meds: . apixaban  2.5 mg Oral Q12H  . furosemide  40 mg Oral Daily  . metoprolol tartrate  25 mg Oral BID  . pantoprazole  40 mg Oral Daily   Continuous Infusions: . amiodarone 60 mg/hr (01/13/16 0420)  . lactated ringers 10 mL/hr at 01/11/16 1721   Antibiotics Given (last 72 hours)    None      Principal Problem:   Atrial fibrillation with RVR (HCC) Active Problems:   Chronic diastolic CHF (congestive heart failure) (HCC)   Hyponatremia   Leukocytosis   Essential hypertension   Atherosclerosis of native coronary artery of native heart without angina pectoris   Acute on chronic combined systolic and diastolic congestive heart failure, NYHA class 3 (HCC)   SOB (shortness of breath)    Time spent: 25 min    [facility] Pager [phone]. If 7PM-7AM, please contact night-coverage at [email or web address], password [facility] 01/13/2016, 11:04 AM  LOS: 7 days

## 2016-01-14 LAB — CBC
HCT: 39.1 % (ref 36.0–46.0)
Hemoglobin: 13.1 g/dL (ref 12.0–15.0)
MCH: 31.1 pg (ref 26.0–34.0)
MCHC: 33.5 g/dL (ref 30.0–36.0)
MCV: 92.9 fL (ref 78.0–100.0)
PLATELETS: 371 10*3/uL (ref 150–400)
RBC: 4.21 MIL/uL (ref 3.87–5.11)
RDW: 14.7 % (ref 11.5–15.5)
WBC: 11.5 10*3/uL — AB (ref 4.0–10.5)

## 2016-01-14 LAB — BASIC METABOLIC PANEL
Anion gap: 14 (ref 5–15)
BUN: 33 mg/dL — ABNORMAL HIGH (ref 6–20)
CALCIUM: 8.7 mg/dL — AB (ref 8.9–10.3)
CO2: 24 mmol/L (ref 22–32)
CREATININE: 0.84 mg/dL (ref 0.44–1.00)
Chloride: 90 mmol/L — ABNORMAL LOW (ref 101–111)
Glucose, Bld: 116 mg/dL — ABNORMAL HIGH (ref 65–99)
Potassium: 3.7 mmol/L (ref 3.5–5.1)
SODIUM: 128 mmol/L — AB (ref 135–145)

## 2016-01-14 MED ORDER — FUROSEMIDE 10 MG/ML IJ SOLN
40.0000 mg | Freq: Once | INTRAMUSCULAR | Status: AC
  Administered 2016-01-14: 40 mg via INTRAVENOUS
  Filled 2016-01-14: qty 4

## 2016-01-14 NOTE — Progress Notes (Signed)
Subjective:  Complains of shortness of breath that started a few minutes ago.  Was given Ativan.    Objective:  Vital Signs in the last 24 hours: BP 109/83 mmHg  Pulse 106  Temp(Src) 98.6 F (37 C) (Rectal)  Resp 27  Ht 5' (1.524 m)  Wt 58.8 kg (129 lb 10.1 oz)  BMI 25.32 kg/m2  SpO2 100%  Physical Exam: Elderly woman mildly anxious Lungs:  Clear  Cardiac:  Rapid irregular rhythm, normal S1 and S2, no S3 Abdomen:  Soft, nontender, no masses Extremities:  No edema present  Intake/Output from previous day: 02/03 0701 - 02/04 0700 In: 1082.5 [I.V.:1082.5] Out: 925 [Urine:925] Weight Filed Weights   01/12/16 0436 01/13/16 0427 01/14/16 0128  Weight: 57.8 kg (127 lb 6.8 oz) 58.1 kg (128 lb 1.4 oz) 58.8 kg (129 lb 10.1 oz)    Lab Results: Basic Metabolic Panel:  Recent Labs  01/13/16 0340 01/14/16 0216  NA 129* 128*  K 4.3 3.7  CL 91* 90*  CO2 22 24  GLUCOSE 128* 116*  BUN 32* 33*  CREATININE 0.98 0.84    CBC:  Recent Labs  01/14/16 0216  WBC 11.5*  HGB 13.1  HCT 39.1  MCV 92.9  PLT 371    BNP    Component Value Date/Time   BNP 94.1 01/09/2016 0447   Telemetry: Atrial fibrillation rate slightly below 100.  Assessment/Plan:  1.  Atrial fibrillation somewhat better control her rate still mildly up.  Continues on IV amiodarone with plans for repeat cardioversion Monday 2.  Acute on chronic systolic and diastolic heart failure some worsening shortness of breath right now and will give additional furosemide 3.  Chronic left bundle branch block  Recommendations:  Mildly short of breath right now that she attributes to anxiety but does not appear to.  Continue amiodarone.  One shot of IV Lasix although BNP level was not that high.      Kerry Hough  MD [facility]  01/14/2016, 11:12 AM

## 2016-01-14 NOTE — Progress Notes (Signed)
PROGRESS NOTE  [name] [name] [id number] DOB: 16-Apr-1932 DOA: 01/06/2016 PCP: Glenda Chroman., MD   Subjective: Denies any shortness of breath, denies any chest pain.  HPI: [name] [name] is a 82 y.o. female with a past medical history significant for HTN and chronic systolic and diastolic CHF, but last EF 55% who presents with SOB and afib.  The patient was in her usual state of health until the beginning of this month when she developed dyspnea and cough, was observed overnight at [facility], treated for pneumonia and discharged. A week later, she developed chest discomfort and weakness, checked her HR on her BP cuff, found it to be elevated, went back to the hospital and was diagnosed with new onset Afib. She reports being "on a drip in the ICU" for five days, then converted to an oral agent (she thinks for HR control), seen by a Cardiologist who discussed cardioversion with her, and then being discharged to a rehab facility about a week ago.  Now, over the last week, she has had progressive weakness and shortness of breath when working with PT at rehabilitation. At night she also describes spells of vague chest discomfort that are intermittent and resolve with lorazepam. She has not had leg swelling, orthopnea, paroxysmal nocturnal dyspnea, frank chest pain, palpitations, or fever, chills, cough, dyspnea at rest, rigors, or sputum.  She was admitted to [facility] last night after a CT angiogram and chest x-ray suggested pulmonary edema without PE, and she was thought to have acute diastolic CHF. She ruled out with serial troponins overnight. The patient's family requested transfer to [facility] because they were hoping to see new doctors.    Assessment/Plan: Principal Problem:   Atrial fibrillation with RVR (HCC) Active Problems:   Chronic diastolic CHF (congestive heart failure) (HCC)   Hyponatremia   Leukocytosis   Essential hypertension   Atherosclerosis  of native coronary artery of native heart without angina pectoris   Acute on chronic combined systolic and diastolic congestive heart failure, NYHA class 3 (HCC)   SOB (shortness of breath)   Afib with RVR and aberrant conduction:  -New? The patient has no history of Afib until earlier this month. CHADs2Vasc 5 (CHF, HTN, Age, sex). She claims she was on a drip for 5 days and then converted to an oral agent for HR control, but the only nodal blocker I see she is on at admission to [facility] is bisoprolol 2.5 mg.  -Diltiazem changed to amiodarone but still not controlled -Hold bispoprolol/HCTZ and ACEi for now -Consult to Cardiology, appreciate care -Was on heparin GTT, switched to Eliquis -TSH ok -DCCV done on 01/11/16, brief period of sinus rhythm back to atrial fibrillation. -EP recommended to continue amiodarone drip, add beta blockers and attempt DC CV on Monday, your current medications.  Acute renal failure Creatinine increased from 0.8 up to 1.2, this is likely secondary to diuresis. Creatinine back to 0.98 after Lasix held. Lasix as needed, patient appears to be euvolemic. Continue to monitor creatinine closely.  Chronic diastolic CHF:  -Old history of ischemic CM. Recent echo with EF 55%. -IV Lasix 40 mg BID -Change Lasix to as needed.  LBBB -No previous records, but this appears to be chronic.   HTN and CAD:  Remote history in Dr. Arlina Robes notes of abnormal stress, decreased LVF, but recent echo with pEF. Not on statin. -Diltiazem gtt-changed to amio  Hyponatremia:  In setting of Afib and resulting CHF.   Anxiety:  -May continue lorazepam PRN  Leukocytosis -?reactive -no fever  Place in SDU until after cardioversion as respiratory status tenuous  Code Status: DNR Family Communication: patient Disposition Plan:    Consultants:  Cardiology  Procedures:    Objective: Filed Vitals:   01/14/16 0919 01/14/16 1000  BP: 116/82 109/83  Pulse: 98  106  Temp:    Resp:  27    Intake/Output Summary (Last 24 hours) at 01/14/16 1104 Last data filed at 01/14/16 1000  Gross per 24 hour  Intake 1212.4 ml  Output   1125 ml  Net   87.4 ml   Filed Weights   01/12/16 0436 01/13/16 0427 01/14/16 0128  Weight: 57.8 kg (127 lb 6.8 oz) 58.1 kg (128 lb 1.4 oz) 58.8 kg (129 lb 10.1 oz)    Exam:   General:  Awake, NAD  Cardiovascular: irr and fast  Respiratory: crackles  Abdomen: +BS, soft  Musculoskeletal: min edema   Data Reviewed: Basic Metabolic Panel:  Recent Labs Lab 01/10/16 0519 01/10/16 1103 01/12/16 0300 01/13/16 0340 01/14/16 0216  NA 133* 135 131* 129* 128*  K 3.4* 4.3 3.8 4.3 3.7  CL 96* 103 98* 91* 90*  CO2 19* 22 20* 22 24  GLUCOSE 338* 168* 119* 128* 116*  BUN 17 17 32* 32* 33*  CREATININE 1.07* 0.88 1.22* 0.98 0.84  CALCIUM 8.8* 8.6* 8.6* 9.0 8.7*   Liver Function Tests: No results for input(s): AST, ALT, ALKPHOS, BILITOT, PROT, ALBUMIN in the last 168 hours. No results for input(s): LIPASE, AMYLASE in the last 168 hours. No results for input(s): AMMONIA in the last 168 hours. CBC:  Recent Labs Lab 01/09/16 0447 01/10/16 0519 01/10/16 1103 01/11/16 0330 01/14/16 0216  WBC 8.4 21.8* 17.1* 17.4* 11.5*  NEUTROABS  --   --  15.1*  --   --   HGB 12.7 14.2 13.7 13.8 13.1  HCT 38.0 43.5 41.7 41.7 39.1  MCV 95.7 96.2 95.0 96.5 92.9  PLT 316 480* 379 446* 371   Cardiac Enzymes:  Recent Labs Lab 01/07/16 1256 01/07/16 1930 01/07/16 2315  TROPONINI <0.03 <0.03 <0.03   BNP (last 3 results)  Recent Labs  01/09/16 0447  BNP 94.1    ProBNP (last 3 results) No results for input(s): PROBNP in the last 8760 hours.  CBG:  Recent Labs Lab 01/10/16 0700  GLUCAP 191*    Recent Results (from the past 240 hour(s))  MRSA PCR Screening     Status: None   Collection Time: 01/06/16 10:25 PM  Result Value Ref Range Status   MRSA by PCR NEGATIVE NEGATIVE Final    Comment:        The  GeneXpert MRSA Assay (FDA approved for NASAL specimens only), is one component of a comprehensive MRSA colonization surveillance program. It is not intended to diagnose MRSA infection nor to guide or monitor treatment for MRSA infections.   MRSA PCR Screening     Status: None   Collection Time: 01/10/16 12:49 PM  Result Value Ref Range Status   MRSA by PCR NEGATIVE NEGATIVE Final    Comment:        The GeneXpert MRSA Assay (FDA approved for NASAL specimens only), is one component of a comprehensive MRSA colonization surveillance program. It is not intended to diagnose MRSA infection nor to guide or monitor treatment for MRSA infections.      Studies: No results found.  Scheduled Meds: . apixaban  2.5 mg Oral Q12H  . furosemide  40 mg Oral Daily  .  metoprolol tartrate  25 mg Oral BID  . pantoprazole  40 mg Oral Daily   Continuous Infusions: . amiodarone 60 mg/hr (01/14/16 0728)  . lactated ringers 10 mL/hr at 01/11/16 1721   Antibiotics Given (last 72 hours)    None      Principal Problem:   Atrial fibrillation with RVR (HCC) Active Problems:   Chronic diastolic CHF (congestive heart failure) (HCC)   Hyponatremia   Leukocytosis   Essential hypertension   Atherosclerosis of native coronary artery of native heart without angina pectoris   Acute on chronic combined systolic and diastolic congestive heart failure, NYHA class 3 (HCC)   SOB (shortness of breath)    Time spent: 25 min    [facility] Pager [phone]. If 7PM-7AM, please contact night-coverage at [email or web address], password [facility] 01/14/2016, 11:04 AM  LOS: 8 days

## 2016-01-15 LAB — BASIC METABOLIC PANEL
Anion gap: 12 (ref 5–15)
BUN: 33 mg/dL — AB (ref 6–20)
CALCIUM: 8.4 mg/dL — AB (ref 8.9–10.3)
CO2: 27 mmol/L (ref 22–32)
CREATININE: 0.79 mg/dL (ref 0.44–1.00)
Chloride: 90 mmol/L — ABNORMAL LOW (ref 101–111)
GFR calc Af Amer: >60 mL/min (ref >60)
GLUCOSE: 118 mg/dL — AB (ref 65–99)
Potassium: 3.6 mmol/L (ref 3.5–5.1)
Sodium: 129 mmol/L — ABNORMAL LOW (ref 135–145)

## 2016-01-15 MED ORDER — ONDANSETRON HCL 4 MG/2ML IJ SOLN
4.0000 mg | Freq: Four times a day (QID) | INTRAMUSCULAR | Status: DC | PRN
  Administered 2016-01-15: 4 mg via INTRAVENOUS

## 2016-01-15 MED ORDER — ONDANSETRON HCL 4 MG/2ML IJ SOLN
INTRAMUSCULAR | Status: AC
  Administered 2016-01-15: 2 mg
  Filled 2016-01-15: qty 2

## 2016-01-15 NOTE — Progress Notes (Signed)
PROGRESS NOTE  [name] [name] [id number] DOB: Nov 02, 1932 DOA: 01/06/2016 PCP: Glenda Chroman., MD   Subjective: Denies any chest pain or shortness of breath. Heart rate somewhat improved. Asking for anxiety medicine, will start long-acting benzodiazepine.  HPI: [name] [name] is a 82 y.o. female with a past medical history significant for HTN and chronic systolic and diastolic CHF, but last EF 55% who presents with SOB and afib.  The patient was in her usual state of health until the beginning of this month when she developed dyspnea and cough, was observed overnight at [facility], treated for pneumonia and discharged. A week later, she developed chest discomfort and weakness, checked her HR on her BP cuff, found it to be elevated, went back to the hospital and was diagnosed with new onset Afib. She reports being "on a drip in the ICU" for five days, then converted to an oral agent (she thinks for HR control), seen by a Cardiologist who discussed cardioversion with her, and then being discharged to a rehab facility about a week ago.  Now, over the last week, she has had progressive weakness and shortness of breath when working with PT at rehabilitation. At night she also describes spells of vague chest discomfort that are intermittent and resolve with lorazepam. She has not had leg swelling, orthopnea, paroxysmal nocturnal dyspnea, frank chest pain, palpitations, or fever, chills, cough, dyspnea at rest, rigors, or sputum.  She was admitted to [facility] last night after a CT angiogram and chest x-ray suggested pulmonary edema without PE, and she was thought to have acute diastolic CHF. She ruled out with serial troponins overnight. The patient's family requested transfer to [facility] because they were hoping to see new doctors.    Assessment/Plan: Principal Problem:   Atrial fibrillation with RVR (HCC) Active Problems:   Chronic diastolic CHF (congestive heart  failure) (HCC)   Hyponatremia   Leukocytosis   Essential hypertension   Atherosclerosis of native coronary artery of native heart without angina pectoris   Acute on chronic combined systolic and diastolic congestive heart failure, NYHA class 3 (HCC)   SOB (shortness of breath)   Afib with RVR and aberrant conduction:  -New? The patient has no history of Afib until earlier this month. CHADs2Vasc 5 (CHF, HTN, Age, sex). She claims she was on a drip for 5 days and then converted to an oral agent for HR control, but the only nodal blocker I see she is on at admission to [facility] is bisoprolol 2.5 mg.  -Diltiazem changed to amiodarone but still not controlled -Hold bispoprolol/HCTZ and ACEi for now -Consult to Cardiology, appreciate care -Was on heparin GTT, switched to Eliquis -TSH ok -DCCV done on 01/11/16, brief period of sinus rhythm back to atrial fibrillation. -EP recommended to continue amiodarone drip, add beta blockers and attempt DC CV on Monday. -Continue current medications.  Acute renal failure Creatinine increased from 0.8 up to 1.2, this is likely secondary to diuresis. Creatinine back to 0.98 after Lasix held. Lasix as needed, patient appears to be euvolemic. Continue to monitor creatinine closely.  Chronic diastolic CHF:  -Old history of ischemic CM. Recent echo with EF 55%. -Was on Lasix 40 mg twice a day, switched to 40 mg daily.  LBBB -No previous records, but this appears to be chronic.   HTN and CAD:  Remote history in Dr. Arlina Robes notes of abnormal stress, decreased LVF, but recent echo with pEF. Not on statin. -Diltiazem gtt-changed to amio  Hyponatremia:  In  setting of Afib and resulting CHF.   Anxiety:  -May continue lorazepam PRN  Leukocytosis -?reactive -no fever  Place in SDU until after cardioversion as respiratory status tenuous  Code Status: DNR Family Communication: patient Disposition Plan:     Consultants:  Cardiology  Procedures:    Objective: Filed Vitals:   01/15/16 0736 01/15/16 0913  BP: 109/86 112/84  Pulse: 82 98  Temp: 98.6 F (37 C)   Resp:      Intake/Output Summary (Last 24 hours) at 01/15/16 0950 Last data filed at 01/15/16 0657  Gross per 24 hour  Intake  346.4 ml  Output   1350 ml  Net -1003.6 ml   Filed Weights   01/13/16 0427 01/14/16 0128 01/15/16 0444  Weight: 58.1 kg (128 lb 1.4 oz) 58.8 kg (129 lb 10.1 oz) 59.1 kg (130 lb 4.7 oz)    Exam:   General:  Awake, NAD  Cardiovascular: irr and fast  Respiratory: crackles  Abdomen: +BS, soft  Musculoskeletal: min edema   Data Reviewed: Basic Metabolic Panel:  Recent Labs Lab 01/10/16 1103 01/12/16 0300 01/13/16 0340 01/14/16 0216 01/15/16 0457  NA 135 131* 129* 128* 129*  K 4.3 3.8 4.3 3.7 3.6  CL 103 98* 91* 90* 90*  CO2 22 20* 22 24 27   GLUCOSE 168* 119* 128* 116* 118*  BUN 17 32* 32* 33* 33*  CREATININE 0.88 1.22* 0.98 0.84 0.79  CALCIUM 8.6* 8.6* 9.0 8.7* 8.4*   Liver Function Tests: No results for input(s): AST, ALT, ALKPHOS, BILITOT, PROT, ALBUMIN in the last 168 hours. No results for input(s): LIPASE, AMYLASE in the last 168 hours. No results for input(s): AMMONIA in the last 168 hours. CBC:  Recent Labs Lab 01/09/16 0447 01/10/16 0519 01/10/16 1103 01/11/16 0330 01/14/16 0216  WBC 8.4 21.8* 17.1* 17.4* 11.5*  NEUTROABS  --   --  15.1*  --   --   HGB 12.7 14.2 13.7 13.8 13.1  HCT 38.0 43.5 41.7 41.7 39.1  MCV 95.7 96.2 95.0 96.5 92.9  PLT 316 480* 379 446* 371   Cardiac Enzymes: No results for input(s): CKTOTAL, CKMB, CKMBINDEX, TROPONINI in the last 168 hours. BNP (last 3 results)  Recent Labs  01/09/16 0447  BNP 94.1    ProBNP (last 3 results) No results for input(s): PROBNP in the last 8760 hours.  CBG:  Recent Labs Lab 01/10/16 0700  GLUCAP 191*    Recent Results (from the past 240 hour(s))  MRSA PCR Screening     Status:  None   Collection Time: 01/06/16 10:25 PM  Result Value Ref Range Status   MRSA by PCR NEGATIVE NEGATIVE Final    Comment:        The GeneXpert MRSA Assay (FDA approved for NASAL specimens only), is one component of a comprehensive MRSA colonization surveillance program. It is not intended to diagnose MRSA infection nor to guide or monitor treatment for MRSA infections.   MRSA PCR Screening     Status: None   Collection Time: 01/10/16 12:49 PM  Result Value Ref Range Status   MRSA by PCR NEGATIVE NEGATIVE Final    Comment:        The GeneXpert MRSA Assay (FDA approved for NASAL specimens only), is one component of a comprehensive MRSA colonization surveillance program. It is not intended to diagnose MRSA infection nor to guide or monitor treatment for MRSA infections.      Studies: No results found.  Scheduled Meds: . apixaban  2.5 mg Oral Q12H  . furosemide  40 mg Oral Daily  . metoprolol tartrate  25 mg Oral BID  . pantoprazole  40 mg Oral Daily   Continuous Infusions: . amiodarone 60 mg/hr (01/15/16 0657)  . lactated ringers 10 mL/hr at 01/11/16 1721   Antibiotics Given (last 72 hours)    None      Principal Problem:   Atrial fibrillation with RVR (HCC) Active Problems:   Chronic diastolic CHF (congestive heart failure) (HCC)   Hyponatremia   Leukocytosis   Essential hypertension   Atherosclerosis of native coronary artery of native heart without angina pectoris   Acute on chronic combined systolic and diastolic congestive heart failure, NYHA class 3 (HCC)   SOB (shortness of breath)    Time spent: 25 min    [place] [facility] Pager [phone]. If 7PM-7AM, please contact night-coverage at [email or web address], password [facility] 01/15/2016, 9:50 AM  LOS: 9 days

## 2016-01-15 NOTE — Progress Notes (Signed)
Subjective:  Breathing is better today, remains in atrial fibrillation and is on amiodarone.   Objective:  Vital Signs in the last 24 hours: BP 112/84 mmHg  Pulse 98  Temp(Src) 98.6 F (37 C) (Rectal)  Resp 14  Ht 5' (1.524 m)  Wt 59.1 kg (130 lb 4.7 oz)  BMI 25.45 kg/m2  SpO2 100%  Physical Exam: Elderly woman in no acute distress  Lungs:  Clear  Cardiac:  Rapid irregular rhythm, normal S1 and S2, no S3 Extremities:  No edema present  Intake/Output from previous day: 02/04 0701 - 02/05 0700 In: 433 [I.V.:433] Out: 1350 [Urine:1350] Weight Filed Weights   01/13/16 0427 01/14/16 0128 01/15/16 0444  Weight: 58.1 kg (128 lb 1.4 oz) 58.8 kg (129 lb 10.1 oz) 59.1 kg (130 lb 4.7 oz)    Lab Results: Basic Metabolic Panel:  Recent Labs  01/14/16 0216 01/15/16 0457  NA 128* 129*  K 3.7 3.6  CL 90* 90*  CO2 24 27  GLUCOSE 116* 118*  BUN 33* 33*  CREATININE 0.84 0.79    CBC:  Recent Labs  01/14/16 0216  WBC 11.5*  HGB 13.1  HCT 39.1  MCV 92.9  PLT 371    BNP    Component Value Date/Time   BNP 94.1 01/09/2016 0447   Telemetry: Atrial fibrillation rate slightly below 100.  Assessment/Plan:  1.  Atrial fibrillation somewhat better control her rate still mildly up.  Continues on IV amiodarone with plans for repeat cardioversion Monday 2.  Acute on chronic systolic and diastolic heart failure some worsening shortness of breath right now and will give additional furosemide 3.  Chronic left bundle branch block  Recommendations:  Cardioversion repeat plan tomorrow.  Keep nothing by mouth after midnight for cardioversion which will be arranged.      Kerry Hough  MD [facility]  01/15/2016, 10:21 AM

## 2016-01-16 ENCOUNTER — Encounter (HOSPITAL_COMMUNITY)
Admission: AD | Disposition: A | Discharge status: Skilled Nursing Facility | Payer: Self-pay | Source: Other Acute Inpatient Hospital | Attending: Internal Medicine

## 2016-01-16 ENCOUNTER — Inpatient Hospital Stay (HOSPITAL_COMMUNITY): Payer: Medicare Other | Admitting: Certified Registered Nurse Anesthetist

## 2016-01-16 ENCOUNTER — Encounter (HOSPITAL_COMMUNITY): Payer: Self-pay | Admitting: *Deleted

## 2016-01-16 DIAGNOSIS — I4891 Unspecified atrial fibrillation: Secondary | ICD-10-CM

## 2016-01-16 DIAGNOSIS — E871 Hypo-osmolality and hyponatremia: Secondary | ICD-10-CM

## 2016-01-16 HISTORY — PX: CARDIOVERSION: SHX1299

## 2016-01-16 LAB — BASIC METABOLIC PANEL
ANION GAP: 16 — AB (ref 5–15)
BUN: 32 mg/dL — ABNORMAL HIGH (ref 6–20)
CALCIUM: 8.6 mg/dL — AB (ref 8.9–10.3)
CO2: 22 mmol/L (ref 22–32)
CREATININE: 0.81 mg/dL (ref 0.44–1.00)
Chloride: 85 mmol/L — ABNORMAL LOW (ref 101–111)
GFR calc Af Amer: >60 mL/min (ref >60)
GFR calc non Af Amer: >60 mL/min (ref >60)
GLUCOSE: 107 mg/dL — AB (ref 65–99)
Potassium: 4.9 mmol/L (ref 3.5–5.1)
Sodium: 123 mmol/L — ABNORMAL LOW (ref 135–145)

## 2016-01-16 SURGERY — CARDIOVERSION
Anesthesia: Monitor Anesthesia Care

## 2016-01-16 MED ORDER — SODIUM CHLORIDE 0.9 % IV SOLN: INTRAVENOUS | Status: DC

## 2016-01-16 MED ORDER — SODIUM CHLORIDE 0.9 % IV SOLN
INTRAVENOUS | Status: DC | PRN
  Administered 2016-01-16: 13:00:00 via INTRAVENOUS

## 2016-01-16 MED ORDER — PROPOFOL 10 MG/ML IV BOLUS
INTRAVENOUS | Status: DC | PRN
  Administered 2016-01-16: 40 mg via INTRAVENOUS

## 2016-01-16 MED ORDER — LIDOCAINE HCL (CARDIAC) 20 MG/ML IV SOLN
INTRAVENOUS | Status: DC | PRN
  Administered 2016-01-16: 40 mg via INTRATRACHEAL

## 2016-01-16 NOTE — Care Management Important Message (Signed)
Important Message  Patient Details  Name: [name] [name] MRN: [id number] Date of Birth: 05/29/1932   Medicare Important Message Given:  Yes    Louanne Belton 01/16/2016, 11:58 AMImportant Message  Patient Details  Name: [name] [name] MRN: [id number] Date of Birth: Sep 15, 1932   Medicare Important Message Given:  Yes    Delissa Silba G 01/16/2016, 11:58 AM

## 2016-01-16 NOTE — Anesthesia Preprocedure Evaluation (Signed)
Anesthesia Evaluation  Patient identified by MRN, date of birth, ID band Patient awake    Reviewed: Allergy & Precautions, NPO status , Patient's Chart, lab work & pertinent test results  Airway Mallampati: II  TM Distance: >3 FB Neck ROM: Full    Dental no notable dental hx. (+) Partial Upper, Poor Dentition, Missing   Pulmonary shortness of breath, pneumonia,    Pulmonary exam normal breath sounds clear to auscultation       Cardiovascular hypertension, + CAD and +CHF  negative cardio ROS Normal cardiovascular exam+ dysrhythmias  Rhythm:Regular Rate:Normal     Neuro/Psych PSYCHIATRIC DISORDERS Anxiety negative neurological ROS     GI/Hepatic negative GI ROS, Neg liver ROS,   Endo/Other  negative endocrine ROS  Renal/GU negative Renal ROS     Musculoskeletal  (+) Arthritis ,   Abdominal   Peds  Hematology negative hematology ROS (+)   Anesthesia Other Findings   Reproductive/Obstetrics negative OB ROS                             Anesthesia Physical  Anesthesia Plan  ASA: III  Anesthesia Plan: MAC   Post-op Pain Management:    Induction: Intravenous  Airway Management Planned: Nasal Cannula  Additional Equipment:   Intra-op Plan:   Post-operative Plan:   Informed Consent: I have reviewed the patients History and Physical, chart, labs and discussed the procedure including the risks, benefits and alternatives for the proposed anesthesia with the patient or authorized representative who has indicated his/her understanding and acceptance.   Dental advisory given  Plan Discussed with: CRNA  Anesthesia Plan Comments:         Anesthesia Quick Evaluation

## 2016-01-16 NOTE — Transfer of Care (Signed)
Immediate Anesthesia Transfer of Care Note  Patient: [name] [name]  Procedure(s) Performed: Procedure(s): CARDIOVERSION (N/A)  Patient Location: Endoscopy Unit  Anesthesia Type:MAC  Level of Consciousness: sedated  Airway & Oxygen Therapy: Patient Spontanous Breathing and Patient connected to nasal cannula oxygen  Post-op Assessment: Report given to RN and Post -op Vital signs reviewed and stable  Post vital signs: Reviewed and stable  Last Vitals:  Filed Vitals:   01/16/16 1238 01/16/16 1316  BP: 127/76 94/46  Pulse:    Temp: 36.4 C   Resp: 15 11    Complications: No apparent anesthesia complications

## 2016-01-16 NOTE — CV Procedure (Signed)
TEE: Anesthesia Dr Jillyn Hidden 40 mg propofol 40 mg lidocaine  DCC x 1 150 J  Converted from AFib with LBBB rate 109 To SB rate 50 with LBBB  On Eliquis TEE last week no LAA thrombus  No immediate neurologic sequelae  Jenkins Rouge

## 2016-01-16 NOTE — Care Management Important Message (Signed)
Important Message  Patient Details  Name: MARYBELL NUTLEY MRN: [id number] Date of Birth: November 15, 1932   Medicare Important Message Given:  Yes    Louanne Belton 01/16/2016, 11:57 AMImportant Message  Patient Details  Name: SHAKARRA DUNKLE MRN: [id number] Date of Birth: [birth year]   Medicare Important Message Given:  Yes    Helmer Dull G 01/16/2016, 11:57 AM

## 2016-01-16 NOTE — Progress Notes (Signed)
PROGRESS NOTE  [name] [name] [id number] DOB: Sep 20, 1932 DOA: 01/06/2016 PCP: Glenda Chroman., MD   Subjective: Reported nausea last night, denies any chest pain or shortness of breath. for DCCV today per cardiology notes.  HPI: [name] [name] is a 82 y.o. female with a past medical history significant for HTN and chronic systolic and diastolic CHF, but last EF 55% who presents with SOB and afib.  The patient was in her usual state of health until the beginning of this month when she developed dyspnea and cough, was observed overnight at [facility], treated for pneumonia and discharged. A week later, she developed chest discomfort and weakness, checked her HR on her BP cuff, found it to be elevated, went back to the hospital and was diagnosed with new onset Afib. She reports being "on a drip in the ICU" for five days, then converted to an oral agent (she thinks for HR control), seen by a Cardiologist who discussed cardioversion with her, and then being discharged to a rehab facility about a week ago.  Now, over the last week, she has had progressive weakness and shortness of breath when working with PT at rehabilitation. At night she also describes spells of vague chest discomfort that are intermittent and resolve with lorazepam. She has not had leg swelling, orthopnea, paroxysmal nocturnal dyspnea, frank chest pain, palpitations, or fever, chills, cough, dyspnea at rest, rigors, or sputum.  She was admitted to [facility] last night after a CT angiogram and chest x-ray suggested pulmonary edema without PE, and she was thought to have acute diastolic CHF. She ruled out with serial troponins overnight. The patient's family requested transfer to [facility] because they were hoping to see new doctors.    Assessment/Plan: Principal Problem:   Atrial fibrillation with RVR (HCC) Active Problems:   Chronic diastolic CHF (congestive heart failure) (HCC)   Hyponatremia    Leukocytosis   Essential hypertension   Atherosclerosis of native coronary artery of native heart without angina pectoris   Acute on chronic combined systolic and diastolic congestive heart failure, NYHA class 3 (HCC)   SOB (shortness of breath)   Afib with RVR and aberrant conduction:  -New? The patient has no history of Afib until earlier this month. CHADs2Vasc 5 (CHF, HTN, Age, sex). She claims she was on a drip for 5 days and then converted to an oral agent for HR control, but the only nodal blocker I see she is on at admission to [facility] is bisoprolol 2.5 mg.  -Diltiazem changed to amiodarone but still not controlled -Hold bispoprolol/HCTZ and ACEi for now -Consult to Cardiology, appreciate care -Was on heparin GTT, switched to Eliquis -TSH ok -DCCV done on 01/11/16, brief period of sinus rhythm back to atrial fibrillation. -Somewhat better control after addition of beta blockers, continue amiodarone drip, for DCCV today.  Acute renal failure Creatinine increased from 0.8 up to 1.2, this is likely secondary to diuresis. Creatinine back to 0.98 after Lasix held. Lasix as needed, patient appears to be euvolemic. Continue to monitor creatinine closely.  Chronic diastolic CHF:  -Old history of ischemic CM. Recent echo with EF 55%. -Was on Lasix 40 mg twice a day, switched to 40 mg daily.  LBBB -No previous records, but this appears to be chronic.   HTN and CAD:  Remote history in Dr. Arlina Robes notes of abnormal stress, decreased LVF, but recent echo with pEF. Not on statin. -Diltiazem gtt-changed to amio  Hyponatremia:  In setting of Afib and resulting CHF.  Anxiety:  -May continue lorazepam PRN  Leukocytosis -?reactive -no fever  Place in SDU until after cardioversion as respiratory status tenuous  Code Status: DNR Family Communication: patient Disposition Plan:    Consultants:  Cardiology  Procedures:    Objective: Filed Vitals:   01/16/16  0700 01/16/16 0916  BP: 107/82 112/82  Pulse: 85 77  Temp: 98.4 F (36.9 C)   Resp: 15     Intake/Output Summary (Last 24 hours) at 01/16/16 1118 Last data filed at 01/16/16 1049  Gross per 24 hour  Intake  779.4 ml  Output    975 ml  Net -195.6 ml   Filed Weights   01/14/16 0128 01/15/16 0444 01/16/16 0500  Weight: 58.8 kg (129 lb 10.1 oz) 59.1 kg (130 lb 4.7 oz) 60.3 kg (132 lb 15 oz)    Exam:   General:  Awake, NAD  Cardiovascular: irr and fast  Respiratory: crackles  Abdomen: +BS, soft  Musculoskeletal: min edema   Data Reviewed: Basic Metabolic Panel:  Recent Labs Lab 01/12/16 0300 01/13/16 0340 01/14/16 0216 01/15/16 0457 01/16/16 0556  NA 131* 129* 128* 129* 123*  K 3.8 4.3 3.7 3.6 4.9  CL 98* 91* 90* 90* 85*  CO2 20* 22 24 27 22   GLUCOSE 119* 128* 116* 118* 107*  BUN 32* 32* 33* 33* 32*  CREATININE 1.22* 0.98 0.84 0.79 0.81  CALCIUM 8.6* 9.0 8.7* 8.4* 8.6*   Liver Function Tests: No results for input(s): AST, ALT, ALKPHOS, BILITOT, PROT, ALBUMIN in the last 168 hours. No results for input(s): LIPASE, AMYLASE in the last 168 hours. No results for input(s): AMMONIA in the last 168 hours. CBC:  Recent Labs Lab 01/10/16 0519 01/10/16 1103 01/11/16 0330 01/14/16 0216  WBC 21.8* 17.1* 17.4* 11.5*  NEUTROABS  --  15.1*  --   --   HGB 14.2 13.7 13.8 13.1  HCT 43.5 41.7 41.7 39.1  MCV 96.2 95.0 96.5 92.9  PLT 480* 379 446* 371   Cardiac Enzymes: No results for input(s): CKTOTAL, CKMB, CKMBINDEX, TROPONINI in the last 168 hours. BNP (last 3 results)  Recent Labs  01/09/16 0447  BNP 94.1    ProBNP (last 3 results) No results for input(s): PROBNP in the last 8760 hours.  CBG:  Recent Labs Lab 01/10/16 0700  GLUCAP 191*    Recent Results (from the past 240 hour(s))  MRSA PCR Screening     Status: None   Collection Time: 01/06/16 10:25 PM  Result Value Ref Range Status   MRSA by PCR NEGATIVE NEGATIVE Final    Comment:         The GeneXpert MRSA Assay (FDA approved for NASAL specimens only), is one component of a comprehensive MRSA colonization surveillance program. It is not intended to diagnose MRSA infection nor to guide or monitor treatment for MRSA infections.   MRSA PCR Screening     Status: None   Collection Time: 01/10/16 12:49 PM  Result Value Ref Range Status   MRSA by PCR NEGATIVE NEGATIVE Final    Comment:        The GeneXpert MRSA Assay (FDA approved for NASAL specimens only), is one component of a comprehensive MRSA colonization surveillance program. It is not intended to diagnose MRSA infection nor to guide or monitor treatment for MRSA infections.      Studies: No results found.  Scheduled Meds: . apixaban  2.5 mg Oral Q12H  . metoprolol tartrate  25 mg Oral BID  . pantoprazole  40 mg Oral Daily   Continuous Infusions: . sodium chloride    . amiodarone 60 mg/hr (01/16/16 0525)   Antibiotics Given (last 72 hours)    None      Principal Problem:   Atrial fibrillation with RVR (HCC) Active Problems:   Chronic diastolic CHF (congestive heart failure) (HCC)   Hyponatremia   Leukocytosis   Essential hypertension   Atherosclerosis of native coronary artery of native heart without angina pectoris   Acute on chronic combined systolic and diastolic congestive heart failure, NYHA class 3 (HCC)   SOB (shortness of breath)    Time spent: 30 min    [facility] Pager [phone]. If 7PM-7AM, please contact night-coverage at [email or web address], password [facility] 01/16/2016, 11:18 AM  LOS: 10 days

## 2016-01-16 NOTE — Anesthesia Postprocedure Evaluation (Signed)
Anesthesia Post Note  Patient: [name] [name]  Procedure(s) Performed: Procedure(s) (LRB): CARDIOVERSION (N/A)  Patient location during evaluation: PACU Anesthesia Type: MAC Level of consciousness: awake and alert Pain management: pain level controlled Vital Signs Assessment: post-procedure vital signs reviewed and stable Respiratory status: spontaneous breathing, nonlabored ventilation, respiratory function stable and patient connected to nasal cannula oxygen Cardiovascular status: stable and blood pressure returned to baseline Anesthetic complications: no    Last Vitals:  Filed Vitals:   01/16/16 1238 01/16/16 1316  BP: 127/76 94/46  Pulse:    Temp: 36.4 C   Resp: 15 11    Last Pain:  Filed Vitals:   01/16/16 1321  PainSc: 0-No pain                 Zenaida Deed

## 2016-01-16 NOTE — Progress Notes (Signed)
TELEMETRY: Reviewed telemetry pt in Atrial fibrillation with controlled rate 85-95.: Filed Vitals:   01/15/16 2340 01/16/16 0400 01/16/16 0500 01/16/16 0700  BP: 117/102 111/70  107/82  Pulse: 98 79  85  Temp: 99.3 F (37.4 C) 97.6 F (36.4 C)  98.4 F (36.9 C)  TempSrc: Rectal Rectal  Oral  Resp: 19 12  15   Height:      Weight:   60.3 kg (132 lb 15 oz)   SpO2: 94% 95%  99%    Intake/Output Summary (Last 24 hours) at 01/16/16 0809 Last data filed at 01/16/16 0500  Gross per 24 hour  Intake  952.6 ml  Output    825 ml  Net  127.6 ml   Filed Weights   01/14/16 0128 01/15/16 0444 01/16/16 0500  Weight: 58.8 kg (129 lb 10.1 oz) 59.1 kg (130 lb 4.7 oz) 60.3 kg (132 lb 15 oz)    Subjective Feels very weak. Complained of nausea yesterday improved with Zofran. No recent BM. Appetite is poor.   Marland Kitchen apixaban  2.5 mg Oral Q12H  . metoprolol tartrate  25 mg Oral BID  . pantoprazole  40 mg Oral Daily   . sodium chloride    . amiodarone 60 mg/hr (01/16/16 0525)    LABS: Basic Metabolic Panel:  Recent Labs  01/15/16 0457 01/16/16 0556  NA 129* 123*  K 3.6 4.9  CL 90* 85*  CO2 27 22  GLUCOSE 118* 107*  BUN 33* 32*  CREATININE 0.79 0.81  CALCIUM 8.4* 8.6*   Liver Function Tests: No results for input(s): AST, ALT, ALKPHOS, BILITOT, PROT, ALBUMIN in the last 72 hours. No results for input(s): LIPASE, AMYLASE in the last 72 hours. CBC:  Recent Labs  01/14/16 0216  WBC 11.5*  HGB 13.1  HCT 39.1  MCV 92.9  PLT 371   Cardiac Enzymes: No results for input(s): CKTOTAL, CKMB, CKMBINDEX, TROPONINI in the last 72 hours. BNP: No results for input(s): PROBNP in the last 72 hours. D-Dimer: No results for input(s): DDIMER in the last 72 hours. Hemoglobin A1C: No results for input(s): HGBA1C in the last 72 hours. Fasting Lipid Panel: No results for input(s): CHOL, HDL, LDLCALC, TRIG, CHOLHDL, LDLDIRECT in the last 72 hours. Thyroid Function Tests: No results for  input(s): TSH, T4TOTAL, T3FREE, THYROIDAB in the last 72 hours.  Invalid input(s): FREET3   Radiology/Studies:  No results found.  PHYSICAL EXAM General: Elderly WF, well nourished, in no acute distress. Head: Normocephalic, atraumatic, sclera non-icteric, oropharynx is clear Neck: Negative for carotid bruits. JVD not elevated. No adenopathy Lungs: Clear bilaterally to auscultation without wheezes, rales, or rhonchi. Breathing is unlabored. Heart: IRRR S1 S2 without murmurs, rubs, or gallops.  Abdomen: Soft, non-tender, non-distended with normoactive bowel sounds. No hepatomegaly. No rebound/guarding. Extremities: No clubbing, cyanosis or edema.  Neuro: Alert and oriented X 3. Moves all extremities spontaneously. Psych:  Responds to questions appropriately with a normal affect.  ASSESSMENT AND PLAN:  1. Atrial fibrillation with RVR. Rate now controlled on IV amiodarone and po metoprolol. On Eliquis for anticoagulation. [place] vasc score of 5. Dose reduced due to age, weight. S/p TEE guided DCCV last week with prompt return to Afib. Will plan repeat DCCV today after additional IV amiodarone load. If fails today would focus on rate control.   2. Acute on chronic systolic and diastolic CHF. EF 40%.  Patient appears euvolemic on exam today. No edema, lungs clear, no JVD. Given additional lasix yesterday. Now sodium  level reduced to 123. Will hold lasix today. Will check CXR today.  3. Hyponatremia. Probably due to diuretics vs. CHF.  4. LBBB chronic  5. Weakness/ deconditioning.   Present on Admission:  . Atrial fibrillation with RVR ([place]) . Chronic diastolic CHF (congestive heart failure) ([place]) . Hyponatremia . Leukocytosis . Essential hypertension . Atherosclerosis of native coronary artery of native heart without angina pectoris  Signed, Dvora Buitron [place], [place] 01/16/2016 8:09 AM

## 2016-01-16 NOTE — Anesthesia Procedure Notes (Signed)
Procedure Name: MAC Date/Time: 01/16/2016 1:09 PM Performed by: Trixie Deis A Pre-anesthesia Checklist: Patient being monitored, Suction available, Timeout performed, Emergency Drugs available and Patient identified Patient Re-evaluated:Patient Re-evaluated prior to inductionOxygen Delivery Method: Ambu bag Preoxygenation: Pre-oxygenation with 100% oxygen Intubation Type: IV induction Dental Injury: Teeth and Oropharynx as per pre-operative assessment

## 2016-01-17 LAB — BASIC METABOLIC PANEL
ANION GAP: 15 (ref 5–15)
BUN: 23 mg/dL — ABNORMAL HIGH (ref 6–20)
CO2: 25 mmol/L (ref 22–32)
Calcium: 8.3 mg/dL — ABNORMAL LOW (ref 8.9–10.3)
Chloride: 84 mmol/L — ABNORMAL LOW (ref 101–111)
Creatinine, Ser: 0.76 mg/dL (ref 0.44–1.00)
GFR calc Af Amer: >60 mL/min (ref >60)
Glucose, Bld: 101 mg/dL — ABNORMAL HIGH (ref 65–99)
POTASSIUM: 3.6 mmol/L (ref 3.5–5.1)
SODIUM: 124 mmol/L — AB (ref 135–145)

## 2016-01-17 MED ORDER — AMIODARONE HCL 200 MG PO TABS
400.0000 mg | ORAL_TABLET | Freq: Two times a day (BID) | ORAL | Status: DC
  Administered 2016-01-17 – 2016-01-23 (×13): 400 mg via ORAL
  Filled 2016-01-17 (×13): qty 2

## 2016-01-17 MED ORDER — FUROSEMIDE 10 MG/ML IJ SOLN
40.0000 mg | Freq: Two times a day (BID) | INTRAMUSCULAR | Status: DC
  Administered 2016-01-17 (×2): 40 mg via INTRAVENOUS
  Filled 2016-01-17 (×2): qty 4

## 2016-01-17 NOTE — Clinical Social Work Note (Signed)
Clinical Social Work Assessment  Patient Details  Name: [name] [name] MRN: [id number] Date of Birth: 1932-01-14  Date of referral:  01/17/16               Reason for consult:  Facility Placement                Permission sought to share information with:  Family Supports, Chartered certified accountant granted to share information::  Yes, Verbal Permission Granted  Name::     "garry" ann watkins  Agency[facility]  Relationship::  daughter  Sport and exercise psychologist Information:     Housing/Transportation Living arrangements for the past 2 months:  [place], [place] of Information:  Patient Patient Interpreter Needed:  None Criminal Activity/Legal Involvement Pertinent to Current Situation/Hospitalization:  No - Comment as needed Significant Relationships:  Adult Children Lives with:  Self Do you feel safe going back to the place where you live?  No Need for family participation in patient care:  No (Coment)  Care giving concerns:  Pt normally lives home alone but was admitted from SNF prior to this admission- pt too weak to return back home at this time   Facilities manager / plan:  CSW spoke with pt at bedside and with dtr over the phone regarding PT recommendation for SNF.  Pt and dtr confirmed that patient was admitted from [facility] where she had been getting rehab for a week prior to being readmitted to hospital for afib  Employment status:  Retired Forensic scientist:  Medicare PT Recommendations:  [place] / Referral to community resources:  [place]  Patient/Family's Response to care:  Pt and dtr are agreeable to return to SNF and prefer [facility] since it is closer to home.  Patient/Family's Understanding of and Emotional Response to Diagnosis, Current Treatment, and Prognosis:  No questions or concerns at this time.  Emotional Assessment Appearance:   Appears stated age Attitude/Demeanor/Rapport:    Affect (typically observed):  Appropriate Orientation:  Oriented to Situation, Oriented to  Time, Oriented to Place, Oriented to Self Alcohol / Substance use:  Not Applicable Psych involvement (Current and /or in the community):  No (Comment)  Discharge Needs  Concerns to be addressed:  Care Coordination Readmission within the last 30 days:  No Current discharge risk:  Physical Impairment Barriers to Discharge:  Continued Medical Work up   [employer], LCSW 01/17/2016, 4:53 PM

## 2016-01-17 NOTE — Evaluation (Signed)
Physical Therapy Evaluation Patient Details Name: [name] [name] MRN: [id number] DOB: 10-Jan-1932 Today's Date: 01/17/2016   History of Present Illness  Patient is a 82 y/o female with hx of HTN, colon ca, anxiety, PNA and chronic systolic and diastolic CHF presents with SOB and A-fib. Recently admitted to [facility] with A-fib and d/ced from [facility] ~1 week ago.  Readmitted from rehab facility due to SOB, weakness and tx to [facility] per request of family. s/p cardioversion 2/6.  Clinical Impression  Patient presents with decreased strength, balance, endurance and drop in Sp02 with activity impacting safe mobility. Pt deconditioned from hospitalization. Tolerated ambulation with Min-Mod A for balance/safety due to weakness. 1 LOB onto bed. Prior to admission, pt at [facility]. Would benefit from return to [facility] to maximize independence and mobility prior to return home.    Follow Up Recommendations SNF    Equipment Recommendations  Other (comment) (defer to next venue)    Recommendations for Other Services OT consult     Precautions / Restrictions Precautions Precautions: Fall Restrictions Weight Bearing Restrictions: No      Mobility  Bed Mobility               General bed mobility comments: Sitting in chair upon PT arrival.  Transfers Overall transfer level: Needs assistance Equipment used: Rolling walker (2 wheeled) Transfers: Sit to/from Stand Sit to Stand: Mod assist         General transfer comment: Mod A to boost from chair with cues for hand placement/technique. Stood from chair x1, from [employer]. Uncontrolled descent onto EOB.  Ambulation/Gait Ambulation/Gait assistance: Min assist;Mod assist Ambulation Distance (Feet): 14 Feet (+10') Assistive device: Rolling walker (2 wheeled) Gait Pattern/deviations: Narrow base of support;Shuffle;Scissoring;Step-through pattern;Decreased stride length;Trunk flexed Gait velocity: decreased   General Gait Details:  Very unsteady gait with constant Min-Mod A for balance as pt impulsive at times with scrissoring gait and narrow BoS. 1 seated rest break with almost LOB onto bed sitting prematurely. Sp02 dropped to 86% on RA. Resolved quickly.  Stairs            Wheelchair Mobility    Modified Rankin (Stroke Patients Only)       Balance Overall balance assessment: Needs assistance Sitting-balance support: Feet supported;No upper extremity supported Sitting balance-Leahy Scale: Fair Sitting balance - Comments: Able to sit unsupported with Min guard for safety as pt with decreased core activation and back pain.   Standing balance support: During functional activity Standing balance-Leahy Scale: Poor Standing balance comment: Reliant on RW for support and Min A.                             Pertinent Vitals/Pain Pain Assessment: No/denies pain    Home Living Family/patient expects to be discharged to:: Skilled nursing facility Living Arrangements: Alone                    Prior Function Level of Independence: Needs assistance         Comments: Pt came from [facility] at [facility] and was participating in exercises and walking. Reports being Independent prior to admission to [facility] (per chart ~1 week ago).      Hand Dominance        Extremity/Trunk Assessment   Upper Extremity Assessment: Defer to OT evaluation;Generalized weakness           Lower Extremity Assessment: Generalized weakness  Communication   Communication: HOH  Cognition Arousal/Alertness: Awake/alert Behavior During Therapy: Flat affect Overall Cognitive Status: Within Functional Limits for tasks assessed (A&O x4.)                      General Comments General comments (skin integrity, edema, etc.): Friend present in room during session per patient.    Exercises        Assessment/Plan    PT Assessment Patient needs continued PT services  PT Diagnosis Difficulty  walking;Generalized weakness   PT Problem List Decreased strength;Cardiopulmonary status limiting activity;Decreased balance;Decreased mobility;Decreased activity tolerance;Decreased knowledge of use of DME  PT Treatment Interventions Balance training;Gait training;Functional mobility training;Therapeutic activities;Therapeutic exercise;Patient/family education;DME instruction   PT Goals (Current goals can be found in the Care Plan section) Acute Rehab PT Goals Patient Stated Goal: to go to rehab to get stronger PT Goal Formulation: With patient Time For Goal Achievement: 01/31/16 Potential to Achieve Goals: Good    Frequency Min 2X/week   Barriers to discharge Decreased caregiver support      Co-evaluation               End of Session Equipment Utilized During Treatment: Gait belt Activity Tolerance: Patient limited by fatigue Patient left: with call bell/phone within reach;in chair;with family/visitor present Nurse Communication: Mobility status         Time: [id number] PT Time Calculation (min) (ACUTE ONLY): 21 min   Charges:   PT Evaluation $PT Eval Moderate Complexity: 1 Procedure     PT G Codes:        [name] [name] 01/17/2016, 3:27 PM [name] [name], [place], DPT [phone]

## 2016-01-17 NOTE — Progress Notes (Signed)
TELEMETRY: Reviewed telemetry pt in NSR rate 60s  Filed Vitals:   01/17/16 0400 01/17/16 0600 01/17/16 0800 01/17/16 0956  BP: 115/66 123/66 131/73 122/69  Pulse: 59 54 55 67  Temp: 97.6 F (36.4 C)     TempSrc: Oral     Resp: 13 20 20    Height:      Weight:  61.6 kg (135 lb 12.9 oz)    SpO2: 95% 96% 98%     Intake/Output Summary (Last 24 hours) at 01/17/16 1027 Last data filed at 01/17/16 0900  Gross per 24 hour  Intake 1229.59 ml  Output    900 ml  Net 329.59 ml   Filed Weights   01/15/16 0444 01/16/16 0500 01/17/16 0600  Weight: 59.1 kg (130 lb 4.7 oz) 60.3 kg (132 lb 15 oz) 61.6 kg (135 lb 12.9 oz)    Subjective Feels very weak. Denies SOB, CP, or nausea.  Marland Kitchen amiodarone  400 mg Oral BID  . apixaban  2.5 mg Oral Q12H  . furosemide  40 mg Intravenous Q12H  . metoprolol tartrate  25 mg Oral BID  . pantoprazole  40 mg Oral Daily   . sodium chloride      LABS: Basic Metabolic Panel:  Recent Labs  01/16/16 0556 01/17/16 0446  NA 123* 124*  K 4.9 3.6  CL 85* 84*  CO2 22 25  GLUCOSE 107* 101*  BUN 32* 23*  CREATININE 0.81 0.76  CALCIUM 8.6* 8.3*   Liver Function Tests: No results for input(s): AST, ALT, ALKPHOS, BILITOT, PROT, ALBUMIN in the last 72 hours. No results for input(s): LIPASE, AMYLASE in the last 72 hours. CBC: No results for input(s): WBC, NEUTROABS, HGB, HCT, MCV, PLT in the last 72 hours. Cardiac Enzymes: No results for input(s): CKTOTAL, CKMB, CKMBINDEX, TROPONINI in the last 72 hours. BNP: No results for input(s): PROBNP in the last 72 hours. D-Dimer: No results for input(s): DDIMER in the last 72 hours. Hemoglobin A1C: No results for input(s): HGBA1C in the last 72 hours. Fasting Lipid Panel: No results for input(s): CHOL, HDL, LDLCALC, TRIG, CHOLHDL, LDLDIRECT in the last 72 hours. Thyroid Function Tests: No results for input(s): TSH, T4TOTAL, T3FREE, THYROIDAB in the last 72 hours.  Invalid input(s):  FREET3   Radiology/Studies:  CLINICAL DATA: Shortness of breath.  EXAM: PORTABLE CHEST 1 VIEW  COMPARISON: Radiographs 01/05/2016, CT 01/06/2016  FINDINGS: Heart at the upper limits normal in size. Perihilar pulmonary edema has progressed from prior exam. Bilateral pleural effusions, likely increased on the right in the interim. No pneumothorax. Kyphoplasty noted in the upper thoracic spine.  IMPRESSION: Progressive pulmonary edema and pleural effusions consistent with CHF.   Electronically Signed  By: Jeb Levering M.D.  On: 01/10/2016 03:59  PHYSICAL EXAM General: Elderly WF, well nourished, in no acute distress. Head: Normocephalic, atraumatic, sclera non-icteric, oropharynx is clear Neck: Negative for carotid bruits. JVD not elevated. No adenopathy Lungs: Reduced BS bilateral bases. Breathing is unlabored. Heart: RRR S1 S2 without murmurs, rubs, or gallops.  Abdomen: Soft, non-tender, non-distended with normoactive bowel sounds. No hepatomegaly. No rebound/guarding. Extremities: No clubbing, cyanosis or edema.  Neuro: Alert and oriented X 3. Moves all extremities spontaneously. Psych:  Responds to questions appropriately with a normal affect.  ASSESSMENT AND PLAN:  1. Atrial fibrillation with RVR. S/p successful DCCV yesterday on amiodarone. Maintaining NSR. Will transition amiodarone to po today. 400 mg bid x 2 weeks then 400 mg daily. On Eliquis for anticoagulation. Mali vasc score  of 5. Dose reduced due to age, weight.   2. Acute on chronic systolic and diastolic CHF. EF 40%.  Weight is up. CXR still shows significant edema and pleural effusions. Will resume lasix 40 mg IV BID.  3. Hyponatremia. Probably due to CHF. Will resume lasix and add fluid restriction.  4. LBBB chronic  5. Weakness/ deconditioning. Will consult PT. Advance activity as tolerated.  Present on Admission:  . Atrial fibrillation with RVR ([place]) . Chronic diastolic CHF  (congestive heart failure) ([place]) . Hyponatremia . Leukocytosis . Essential hypertension . Atherosclerosis of native coronary artery of native heart without angina pectoris  Signed, [name] [name], [place] 01/17/2016 10:27 AM

## 2016-01-17 NOTE — Progress Notes (Signed)
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 Continue Eliquis and amiodarone. Consult PT and CSW, probably she will need a SNF, patient is from [place]  HPI: [name] [name] is a 82 y.o. female with a past medical history significant for HTN and chronic systolic and diastolic CHF, but last EF 55% who presents with SOB and afib.  The patient was in her usual state of health until the beginning of this month when she developed dyspnea and cough, was observed overnight at [facility], treated for pneumonia and discharged. A week later, she developed chest discomfort and weakness, checked her HR on her BP cuff, found it to be elevated, went back to the hospital and was diagnosed with new onset Afib. She reports being "on a drip in the ICU" for five days, then converted to an oral agent (she thinks for HR control), seen by a Cardiologist who discussed cardioversion with her, and then being discharged to a rehab facility about a week ago.  Now, over the last week, she has had progressive weakness and shortness of breath when working with PT at rehabilitation. At night she also describes spells of vague chest discomfort that are intermittent and resolve with lorazepam. She has not had leg swelling, orthopnea, paroxysmal nocturnal dyspnea, frank chest pain, palpitations, or fever, chills, cough, dyspnea at rest, rigors, or sputum.  She was admitted to [facility] last night after a CT angiogram and chest x-ray suggested pulmonary edema without PE, and she was thought to have acute diastolic CHF. She ruled out with serial troponins overnight. The patient's family requested transfer to [facility] because they were hoping to see new doctors.    Assessment/Plan: Principal Problem:   Atrial fibrillation with RVR (HCC) Active Problems:   Chronic  diastolic CHF (congestive heart failure) (HCC)   Hyponatremia   Leukocytosis   Essential hypertension   Atherosclerosis of native coronary artery of native heart without angina pectoris   Acute on chronic combined systolic and diastolic congestive heart failure, NYHA class 3 (HCC)   SOB (shortness of breath)   Afib with RVR and aberrant conduction:  -New? The patient has no history of Afib until earlier this month. CHADs2Vasc 5 (CHF, HTN, Age, sex). She claims she was on a drip for 5 days and then converted to an oral agent for HR control, but the only nodal blocker I see she is on at admission to [facility] is bisoprolol 2.5 mg.  -Diltiazem changed to amiodarone but still not controlled -Hold bispoprolol/HCTZ and ACEi for now -Consult to Cardiology, appreciate care -Was on heparin GTT, switched to Eliquis -TSH ok -DCCV done on 01/11/16, brief period of sinus rhythm back to atrial fibrillation. -Status post deceased CVA, switch amiodarone to oral, continue liquids and beta blockers. PT to evaluate and CSW.  Acute renal failure Creatinine increased from 0.8 up to 1.2, this is likely secondary to diuresis. Creatinine back to 0.98 after Lasix held. Lasix as needed, patient appears to be euvolemic. Continue to monitor creatinine closely.  Chronic diastolic CHF:  -Old history of ischemic CM. Recent echo with EF 55%. -Was on Lasix 40 mg twice a day, switched to 40 mg daily.  LBBB -No previous records, but this appears to be chronic.   HTN and CAD:  Remote history in Dr. Arlina Robes notes of abnormal stress, decreased LVF, but recent echo with pEF. Not on statin. -  Diltiazem gtt-changed to amio  Hyponatremia:  In setting of Afib and resulting CHF.   Anxiety:  -May continue lorazepam PRN  Leukocytosis -?reactive -no fever  Place in SDU until after cardioversion as respiratory status tenuous  Code Status: DNR Family Communication: patient Disposition Plan:     Consultants:  Cardiology  Procedures:    Objective: Filed Vitals:   01/17/16 0800 01/17/16 0956  BP: 131/73 122/69  Pulse: 55 67  Temp:    Resp: 20     Intake/Output Summary (Last 24 hours) at 01/17/16 1047 Last data filed at 01/17/16 0900  Gross per 24 hour  Intake 1229.59 ml  Output    900 ml  Net 329.59 ml   Filed Weights   01/15/16 0444 01/16/16 0500 01/17/16 0600  Weight: 59.1 kg (130 lb 4.7 oz) 60.3 kg (132 lb 15 oz) 61.6 kg (135 lb 12.9 oz)    Exam:   General:  Awake, NAD  Cardiovascular: irr and fast  Respiratory: crackles  Abdomen: +BS, soft  Musculoskeletal: min edema   Data Reviewed: Basic Metabolic Panel:  Recent Labs Lab 01/13/16 0340 01/14/16 0216 01/15/16 0457 01/16/16 0556 01/17/16 0446  NA 129* 128* 129* 123* 124*  K 4.3 3.7 3.6 4.9 3.6  CL 91* 90* 90* 85* 84*  CO2 22 24 27 22 25   GLUCOSE 128* 116* 118* 107* 101*  BUN 32* 33* 33* 32* 23*  CREATININE 0.98 0.84 0.79 0.81 0.76  CALCIUM 9.0 8.7* 8.4* 8.6* 8.3*   Liver Function Tests: No results for input(s): AST, ALT, ALKPHOS, BILITOT, PROT, ALBUMIN in the last 168 hours. No results for input(s): LIPASE, AMYLASE in the last 168 hours. No results for input(s): AMMONIA in the last 168 hours. CBC:  Recent Labs Lab 01/10/16 1103 01/11/16 0330 01/14/16 0216  WBC 17.1* 17.4* 11.5*  NEUTROABS 15.1*  --   --   HGB 13.7 13.8 13.1  HCT 41.7 41.7 39.1  MCV 95.0 96.5 92.9  PLT 379 446* 371   Cardiac Enzymes: No results for input(s): CKTOTAL, CKMB, CKMBINDEX, TROPONINI in the last 168 hours. BNP (last 3 results)  Recent Labs  01/09/16 0447  BNP 94.1    ProBNP (last 3 results) No results for input(s): PROBNP in the last 8760 hours.  CBG: No results for input(s): GLUCAP in the last 168 hours.  Recent Results (from the past 240 hour(s))  MRSA PCR Screening     Status: None   Collection Time: 01/10/16 12:49 PM  Result Value Ref Range Status   MRSA by PCR NEGATIVE  NEGATIVE Final    Comment:        The GeneXpert MRSA Assay (FDA approved for NASAL specimens only), is one component of a comprehensive MRSA colonization surveillance program. It is not intended to diagnose MRSA infection nor to guide or monitor treatment for MRSA infections.      Studies: No results found.  Scheduled Meds: . amiodarone  400 mg Oral BID  . apixaban  2.5 mg Oral Q12H  . furosemide  40 mg Intravenous Q12H  . metoprolol tartrate  25 mg Oral BID  . pantoprazole  40 mg Oral Daily   Continuous Infusions: . sodium chloride     Antibiotics Given (last 72 hours)    None      Principal Problem:   Atrial fibrillation with RVR (HCC) Active Problems:   Chronic diastolic CHF (congestive heart failure) (HCC)   Hyponatremia   Leukocytosis   Essential hypertension   Atherosclerosis of  native coronary artery of native heart without angina pectoris   Acute on chronic combined systolic and diastolic congestive heart failure, NYHA class 3 (HCC)   SOB (shortness of breath)    Time spent: 30 min    [facility] Pager [phone]. If 7PM-7AM, please contact night-coverage at [email or web address], password [facility] 01/17/2016, 10:47 AM  LOS: 11 days

## 2016-01-17 NOTE — Progress Notes (Signed)
Patient is transferred to 3E27 per hospital bed.  Daughter, Lonn [place], was at the bedside during this transfer.

## 2016-01-17 NOTE — Clinical Social Work Placement (Signed)
   CLINICAL SOCIAL WORK PLACEMENT  NOTE  Date:  01/17/2016  Patient Details  Name: [name] [name] MRN: [id number] Date of Birth: 04-Dec-1932  Clinical Social Work is seeking post-discharge placement for this patient at the [place] level of care (*CSW will initial, date and re-position this form in  chart as items are completed):  Yes   Patient/family provided with [place] Work Department's list of facilities offering this level of care within the geographic area requested by the patient (or if unable, by the patient's family).  Yes   Patient/family informed of their freedom to choose among providers that offer the needed level of care, that participate in Medicare, Medicaid or managed care program needed by the patient, have an available bed and are willing to accept the patient.  Yes   Patient/family informed of [place]'s ownership interest in [facility] and [facility], as well as of the fact that they are under no obligation to receive care at these facilities.  PASRR submitted to EDS on       PASRR number received on       Existing PASRR number confirmed on 01/17/16     FL2 transmitted to all facilities in geographic area requested by pt/family on 01/17/16     FL2 transmitted to all facilities within larger geographic area on       Patient informed that his/her managed care company has contracts with or will negotiate with certain facilities, including the following:            Patient/family informed of bed offers received.  Patient chooses bed at       Physician recommends and patient chooses bed at      Patient to be transferred to   on  .  Patient to be transferred to facility by       Patient family notified on   of transfer.  Name of family member notified:        PHYSICIAN Please sign FL2     Additional Comment:    _______________________________________________ Cranford Mon, LCSW 01/17/2016, 4:57  PM

## 2016-01-17 NOTE — Evaluation (Signed)
Occupational Therapy Evaluation Patient Details Name: [name] [name] MRN: [id number] DOB: 1931/12/11 Today's Date: 01/17/2016    History of Present Illness Patient is a 82 y/o female with hx of HTN, colon ca, anxiety, PNA and chronic systolic and diastolic CHF presents with SOB and A-fib. Recently admitted to [facility] with A-fib and d/ced from [place] [facility] ~1 week ago.  Readmitted from rehab facility due to SOB, weakness and tx to [facility] per request of family. s/p cardioversion 2/6.   Clinical Impression   Pt admitted with above. She demonstrates the below listed deficits and will benefit from continued OT to maximize safety and independence with BADLs.  Pt presents to OT with impaired balance, and decreased activity tolerance.  She currently requires min - mod A for ADLs, and fatigues quite quickly.   She will benefit from SNF level rehab to allow her to maximize safety and independence prior to returning home.  Will follow acutely.       Follow Up Recommendations  SNF    Equipment Recommendations  None recommended by OT    Recommendations for Other Services       Precautions / Restrictions Precautions Precautions: Fall Restrictions Weight Bearing Restrictions: No      Mobility Bed Mobility Overal bed mobility: Needs Assistance Bed Mobility: Supine to Sit;Sit to Supine     Supine to sit: Mod assist Sit to supine: Mod assist   General bed mobility comments: Pt requires assist to lift trunk and to scoot hips to EOB, and requires assist to lift LEs back onto bed   Transfers Overall transfer level: Needs assistance Equipment used: Rolling walker (2 wheeled) Transfers: Sit to/from [facility] Sit to Stand: Mod assist Stand pivot transfers: Mod assist       General transfer comment: Mod A to move into standing and for balance     Balance Overall balance assessment: Needs assistance Sitting-balance support: Feet supported Sitting balance-Leahy  Scale: Fair Sitting balance - Comments: Able to sit unsupported with Min guard for safety as pt with decreased core activation and back pain.   Standing balance support: Bilateral upper extremity supported Standing balance-Leahy Scale: Poor Standing balance comment: reliant on UE support                             ADL Overall ADL's : Needs assistance/impaired Eating/Feeding: Set up;Sitting;Bed level   Grooming: Wash/dry hands;Wash/dry face;Oral care;Brushing hair;Minimal assistance;Sitting   Upper Body Bathing: Minimal assitance;Sitting   Lower Body Bathing: Moderate assistance;Sit to/from stand   Upper Body Dressing : Moderate assistance;Sitting   Lower Body Dressing: Moderate assistance;Sit to/from stand   Toilet Transfer: Moderate assistance;Stand-pivot;BSC;RW   Toileting- Clothing Manipulation and Hygiene: Sit to/from stand;Moderate assistance       Functional mobility during ADLs: Minimal assistance;Moderate assistance;Rolling walker General ADL Comments: Pt fatigues quickly requiring assist to complete tasks.  Requires min - mod A for balance      Vision     Perception     Praxis      Pertinent Vitals/Pain Pain Assessment: No/denies pain     Hand Dominance Right   Extremity/Trunk Assessment Upper Extremity Assessment Upper Extremity Assessment: Generalized weakness   Lower Extremity Assessment Lower Extremity Assessment: Defer to PT evaluation   Cervical / Trunk Assessment Cervical / Trunk Assessment: Kyphotic   Communication Communication Communication: HOH   Cognition Arousal/Alertness: Awake/alert Behavior During Therapy: Flat affect Overall Cognitive Status: Within Functional Limits for  tasks assessed                     General Comments       Exercises       Shoulder Instructions      Home Living Family/patient expects to be discharged to:: Skilled nursing facility Living Arrangements: Alone                                       Prior Functioning/Environment Level of Independence: Needs assistance        Comments: Pt came from [place] [facility] at [facility] and was participating in exercises and walking. Reports being Independent prior to admission to [place] [facility] (per chart ~1 week ago).     OT Diagnosis: Generalized weakness   OT Problem List: Decreased strength;Decreased activity tolerance;Impaired balance (sitting and/or standing);Decreased safety awareness;Decreased knowledge of use of DME or AE;Cardiopulmonary status limiting activity   OT Treatment/Interventions: Self-care/ADL training;Therapeutic exercise;DME and/or AE instruction;Therapeutic activities;Patient/family education;Balance training    OT Goals(Current goals can be found in the care plan section) Acute Rehab OT Goals Patient Stated Goal: to regain independence  OT Goal Formulation: With patient Time For Goal Achievement: 01/31/16 Potential to Achieve Goals: Good ADL Goals Pt Will Perform Grooming: with min assist;standing Pt Will Perform Upper Body Bathing: with set-up;with supervision;sitting Pt Will Perform Lower Body Bathing: with min assist;sit to/from stand Pt Will Perform Upper Body Dressing: with supervision;sitting Pt Will Perform Lower Body Dressing: with min assist;sit to/from stand Pt Will Transfer to Toilet: with min assist;ambulating;regular height toilet;bedside commode;grab bars Pt Will Perform Toileting - Clothing Manipulation and hygiene: with min assist;sit to/from stand Additional ADL Goal #1: Pt will actively participate in 20 mins therapeutic activity to increase endurance needed for ADLs  OT Frequency: Min 2X/week   Barriers to D/C: Decreased caregiver support          Co-evaluation              End of Session Equipment Utilized During Treatment: Rolling walker;Oxygen Nurse Communication: Mobility status  Activity Tolerance: Patient limited by fatigue Patient left: in bed;with call  bell/phone within reach;with bed alarm set   Time: [id number] OT Time Calculation (min): 12 min Charges:  OT General Charges $OT Visit: 1 Procedure OT Evaluation $OT Eval Moderate Complexity: 1 Procedure G-Codes:    [name] [name] Feb 13, 2016, 4:54 PM

## 2016-01-17 NOTE — NC FL2 (Signed)
[place] MEDICAID FL2 LEVEL OF CARE SCREENING TOOL     IDENTIFICATION  Patient Name: [name] [name] Birthdate: 1932/01/05 Sex: female Admission Date (Current Location): 01/06/2016  [facility] and [place] Number:  [employer] and Address:  The [place], [place] [address], [place], [place] [address]      Provider Number: [id number]  Attending Physician Name and Address:  Verlee Monte, MD  Relative Name and Phone Number:       Current Level of Care: Hospital Recommended Level of Care: [place] Prior Approval Number:    Date Approved/Denied:   PASRR Number: [id number]  Discharge Plan: SNF    Current Diagnoses: Patient Active Problem List   Diagnosis Date Noted  . SOB (shortness of breath)   . Acute on chronic combined systolic and diastolic congestive heart failure, NYHA class 3 ([place])   . Atrial fibrillation with RVR ([place]) 01/06/2016  . Chronic diastolic CHF (congestive heart failure) ([place]) 01/06/2016  . Hyponatremia 01/06/2016  . Leukocytosis 01/06/2016  . Essential hypertension 01/06/2016  . Atherosclerosis of native coronary artery of native heart without angina pectoris 01/06/2016  . Unspecified constipation 04/14/2013  . Anal pain 04/14/2013    Orientation RESPIRATION BLADDER Height & Weight     Self, Time, Situation, Place  O2 (3L [place]) Indwelling catheter Weight: 135 lb 12.9 oz (61.6 kg) Height:  5' (152.4 cm)  BEHAVIORAL SYMPTOMS/MOOD NEUROLOGICAL BOWEL NUTRITION STATUS      Continent Diet (cardiac)  AMBULATORY STATUS COMMUNICATION OF NEEDS Skin   Limited Assist Verbally Normal                       Personal Care Assistance Level of Assistance  Bathing, Dressing Bathing Assistance: Limited assistance   Dressing Assistance: Limited assistance     Functional Limitations Info             SPECIAL CARE FACTORS FREQUENCY  PT (By licensed PT), OT (By licensed OT)     PT Frequency: 5/wk OT  Frequency: 5/wk            Contractures      Additional Factors Info  Code Status, Allergies Code Status Info: DNR Allergies Info: Penicillins, Sulfa Drugs Cross Reactors           Current Medications (01/17/2016):  This is the current hospital active medication list Current Facility-Administered Medications  Medication Dose Route Frequency Provider Last Rate Last Dose  . acetaminophen (TYLENOL) tablet 650 mg  650 mg Oral Q6H PRN Edwin Dada, MD   650 mg at 01/13/16 1555   Or  . acetaminophen (TYLENOL) suppository 650 mg  650 mg Rectal Q6H PRN Edwin Dada, MD      . amiodarone (PACERONE) tablet 400 mg  400 mg Oral BID Peter M Martinique, MD   400 mg at 01/17/16 1124  . apixaban (ELIQUIS) tablet 2.5 mg  2.5 mg Oral Q12H Eudelia Bunch, RPH   2.5 mg at 01/17/16 0955  . furosemide (LASIX) injection 40 mg  40 mg Intravenous Q12H Peter M Martinique, MD   40 mg at 01/17/16 1124  . LORazepam (ATIVAN) tablet 0.5-1 mg  0.5-1 mg Oral Q4H PRN Geradine Girt, DO   1 mg at 01/15/16 2127  . menthol-cetylpyridinium (CEPACOL) lozenge 3 mg  1 lozenge Oral PRN Verlee Monte, MD   3 mg at 01/12/16 0925  . metoprolol tartrate (LOPRESSOR) tablet 25 mg  25 mg Oral BID  Baldwin Jamaica, PA-C   25 mg at 01/17/16 [id number]  . morphine 2 MG/ML injection 2 mg  2 mg Intravenous Q2H PRN Jeryl [place], NP   2 mg at 01/15/16 2338  . ondansetron (ZOFRAN) injection 4 mg  4 mg Intravenous Q6H PRN Dianne Dun, NP   4 mg at 01/15/16 1942  . pantoprazole (PROTONIX) EC tablet 40 mg  40 mg Oral Daily Edwin Dada, MD   40 mg at 01/17/16 0955  . polyethylene glycol (MIRALAX / GLYCOLAX) packet 17 g  17 g Oral Daily PRN Edwin Dada, MD   17 g at 01/07/16 1350  . senna-docusate (Senokot-S) tablet 1 tablet  1 tablet Oral QHS PRN Edwin Dada, MD         Discharge Medications: Please see discharge summary for a list of discharge medications.  Relevant Imaging  Results:  Relevant Lab Results:   Additional Information SS#: [id number]  Cranford Mon,

## 2016-01-18 ENCOUNTER — Encounter (HOSPITAL_COMMUNITY): Payer: Self-pay | Admitting: Cardiovascular Disease

## 2016-01-18 DIAGNOSIS — R0602 Shortness of breath: Secondary | ICD-10-CM

## 2016-01-18 DIAGNOSIS — D72829 Elevated white blood cell count, unspecified: Secondary | ICD-10-CM

## 2016-01-18 LAB — CBC
HEMATOCRIT: 41.8 % (ref 36.0–46.0)
HEMOGLOBIN: 14.1 g/dL (ref 12.0–15.0)
MCH: 31.1 pg (ref 26.0–34.0)
MCHC: 33.7 g/dL (ref 30.0–36.0)
MCV: 92.3 fL (ref 78.0–100.0)
Platelets: 356 10*3/uL (ref 150–400)
RBC: 4.53 MIL/uL (ref 3.87–5.11)
RDW: 14.3 % (ref 11.5–15.5)
WBC: 13.2 10*3/uL — ABNORMAL HIGH (ref 4.0–10.5)

## 2016-01-18 MED ORDER — BISACODYL 10 MG RE SUPP
10.0000 mg | Freq: Once | RECTAL | Status: AC
  Administered 2016-01-18: 10 mg via RECTAL
  Filled 2016-01-18: qty 1

## 2016-01-18 MED ORDER — POLYETHYLENE GLYCOL 3350 17 G PO PACK
17.0000 g | PACK | Freq: Every day | ORAL | Status: DC
  Administered 2016-01-18: 17 g via ORAL
  Filled 2016-01-18 (×3): qty 1

## 2016-01-18 MED ORDER — FUROSEMIDE 10 MG/ML IJ SOLN
40.0000 mg | Freq: Two times a day (BID) | INTRAMUSCULAR | Status: DC
  Administered 2016-01-18 – 2016-01-19 (×3): 40 mg via INTRAVENOUS
  Filled 2016-01-18 (×3): qty 4

## 2016-01-18 NOTE — Progress Notes (Signed)
         Subjective: Doesn't feel well, but no specific complaints.  Objective: Vital signs in last 24 hours: Temp:  [97.8 F (36.6 C)-98.3 F (36.8 C)] 97.8 F (36.6 C) (02/08 0510) Pulse Rate:  [62-67] 63 (02/08 0510) Resp:  [17-24] 17 (02/08 0510) BP: (113-124)/(51-69) 113/51 mmHg (02/08 0510) SpO2:  [90 %-98 %] 90 % (02/08 0510) Weight:  [130 lb 0.8 oz (58.99 kg)-130 lb 11.7 oz (59.3 kg)] 130 lb 0.8 oz (58.99 kg) (02/08 0510) Weight change: -5 lb 1.1 oz (-2.3 kg) Last BM Date: 01/15/16 Intake/Output from previous day: -1015 02/07 0701 - 02/08 0700 In: 766.7 [P.O.:580; I.V.:186.7] Out: 1825 [Urine:1825] Intake/Output this shift:    PE: General:Pleasant affect but flat, NAD Skin:Warm and dry, brisk capillary refill HEENT:normocephalic, sclera clear, mucus membranes moist Heart:S1S2 RRR without murmur, [place], rub or click Lungs:diminished without rales, rhonchi, or wheezes [id number], non tender, + BS, do not palpate liver spleen or masses Ext:no lower ext edema, 2+ pedal pulses, 2+ radial pulses Neuro:alert and oriented X 3, MAE, follows commands, + facial symmetry Tele:  SR 1st degree AV block   Lab Results:  Recent Labs  01/18/16 0805  WBC 13.2*  HGB 14.1  HCT 41.8  PLT 356   BMET  Recent Labs  01/16/16 0556 01/17/16 0446  NA 123* 124*  K 4.9 3.6  CL 85* 84*  CO2 22 25  GLUCOSE 107* 101*  BUN 32* 23*  CREATININE 0.81 0.76  CALCIUM 8.6* 8.3*   No results for input(s): TROPONINI in the last 72 hours.  Invalid input(s): CK, MB  No results found for: CHOL, HDL, LDLCALC, LDLDIRECT, TRIG, CHOLHDL No results found for: HGBA1C   Lab Results  Component Value Date   TSH 0.906 01/07/2016      Studies/Results: No results found.  Medications: I have reviewed the patient's current medications. Scheduled Meds: . amiodarone  400 mg Oral BID  . apixaban  2.5 mg Oral Q12H  . furosemide  40 mg Intravenous BID  . metoprolol tartrate  25 mg Oral BID    . pantoprazole  40 mg Oral Daily   Continuous Infusions:  PRN Meds:.acetaminophen **OR** acetaminophen, LORazepam, menthol-cetylpyridinium, morphine injection, ondansetron, polyethylene glycol, senna-docusate  Assessment/Plan:  1. Atrial fibrillation with RVR. S/p successful DCCV 01/16/16 on amiodarone. Maintaining NSR. transitioned amiodarone to po. 400 mg bid x 2 weeks then 400 mg daily. On Eliquis for anticoagulation. [place] vasc score of 5. Dose reduced due to age, weight.   2. Acute on chronic systolic and diastolic CHF. EF 40%. Weight was up-today better. CXR still shows significant edema and pleural effusions. Resumed lasix 40 mg IV BID. And neg 1L yesterday--overall since admit [phone] continue IV lasix   3. Hyponatremia. Probably due to CHF. Will resume lasix and add fluid restriction.  4. LBBB chronic  5. Weakness/ deconditioning. Will consult PT. Advance activity as tolerated. PT recommended SNF.  6. CKD improved.  7. Elevated WBC afebrile   LOS: 12 days   Time spent with pt. :15 minutes. [facility] R  Nurse Practitioner Certified Pager [phone] or after 5pm and on weekends call [phone] 01/18/2016, 8:31 AM   Patient seen and examined and history reviewed. Agree with above findings and plan. Maintaining NSR on amiodarone. Tolerating medication well. Good diuresis with IV lasix. No BMET done today- will order. Continue IV lasix for now.   Kalix Meinecke Martin[place], [place] 01/18/2016 12:01 PM

## 2016-01-18 NOTE — Progress Notes (Signed)
[facility]                                                                              Patient Demographics  [name] [name], is a 82 y.o. female, DOB - May 11, 1932, [id number]  Admit date - 01/06/2016   Admitting Physician Janece Canterbury, MD  Outpatient Primary MD for the patient is VYAS,DHRUV B., MD  LOS - 12   No chief complaint on file.     HPI on 01/06/16 by Dr. Myrene Buddy [name] [name] is a 82 y.o. female with a past medical history significant for HTN and chronic systolic and diastolic CHF, but last EF 55% who presents with SOB and afib.  The patient was in her usual state of health until the beginning of this month when she developed dyspnea and cough, was observed overnight at [facility], treated for pneumonia and discharged. A week later, she developed chest discomfort and weakness, checked her HR on her BP cuff, found it to be elevated, went back to the hospital and was diagnosed with new onset Afib. She reports being "on a drip in the ICU" for five days, then converted to an oral agent (she thinks for HR control), seen by a Cardiologist who discussed cardioversion with her, and then being discharged to a rehab facility about a week ago.  Now, over the last week, she has had progressive weakness and shortness of breath when working with PT at rehabilitation. At night she also describes spells of vague chest discomfort that are intermittent and resolve with lorazepam. She has not had leg swelling, orthopnea, paroxysmal nocturnal dyspnea, frank chest pain, palpitations, or fever, chills, cough, dyspnea at rest, rigors, or sputum.  She was admitted to [facility] last night after a CT angiogram and chest x-ray suggested pulmonary edema without PE, and she was thought to have acute diastolic CHF. She ruled out with serial troponins overnight. Today, the patient's family requested transfer to [facility] because they were hoping to see new  doctors. On arrival, the patient was comfortable but in atrial fibrillation with rate 110-150 bpm.   Assessment & Plan   New Afib with RVR and aberrant conduction -CHADs2Vasc 5 (CHF, HTN, Age, gender).  -Cardiology consulted and appreciated -DCCV 01/11/16, however was back in Afib -Currently in NSR -Continue amiodarone, Eliquis  Acute renal failure -Resolved -Continue to monitor BMP  Acute combined systolic and diastolic CHF  -Old history of ischemic CM -Echocardiogram 01/09/16: EF 35-40% -Continue Lasix IV 40mg  BID -Monitor intake/output, daily weights  LBBB -No previous records, but this appears to be chronic.  HTN and CAD -Stable  Hyponatremia -In setting of Afib and resulting CHF -Continue to monitor BMP  Anxiety -Continue  lorazepam PRN  Leukocytosis -?reactive, trending downard -Currently afebrile  Constipation -Will add on miralax and dulcolax supp  Code Status: DNR  Family Communication: None at bedside.  Disposition Plan: Admitted. Will need SNF when ready for d/c. Continue IV Lasix.  Time Spent in minutes   30 minutes  Procedures  Cardioversion Echocardiogram  Consults   Cardiology  DVT Prophylaxis  Eliquis  Lab Results  Component Value Date   PLT 356 01/18/2016    Medications  Scheduled Meds: . amiodarone  400 mg Oral BID  . apixaban  2.5 mg Oral Q12H  . furosemide  40 mg Intravenous BID  . metoprolol tartrate  25 mg Oral BID  . pantoprazole  40 mg Oral Daily  . polyethylene glycol  17 g Oral Daily   Continuous Infusions:  PRN Meds:.acetaminophen **OR** acetaminophen, LORazepam, menthol-cetylpyridinium, morphine injection, ondansetron, senna-docusate  Antibiotics    Anti-infectives    None     Subjective:   Franchot Gallo seen and examined today.  Patient feels very weak.  She does not feel well, but has no specific complaints.  States she has not had a bowel movement in weeks.  Denies chest pain, shortness of  breath, palpitations, abdominal pain.    Objective:   Filed Vitals:   01/17/16 2139 01/18/16 0038 01/18/16 0510 01/18/16 1151  BP: 124/68 122/62 113/51 109/54  Pulse: 65 62 63 66  Temp: 98.3 F (36.8 C) 98 F (36.7 C) 97.8 F (36.6 C) 97.9 F (36.6 C)  TempSrc: Oral Oral Oral Oral  Resp: 20 18 17 16   Height:      Weight:   58.99 kg (130 lb 0.8 oz)   SpO2: 96% 98% 90% 91%    Wt Readings from Last 3 Encounters:  01/18/16 58.99 kg (130 lb 0.8 oz)  08/20/15 56.7 kg (125 lb)  03/15/15 57.607 kg (127 lb)     Intake/Output Summary (Last 24 hours) at 01/18/16 1419 Last data filed at 01/18/16 1409  Gross per 24 hour  Intake    940 ml  Output   2225 ml  Net  -1285 ml    Exam  General: Well developed, elderly, thin, NAD, appears stated age  82: NCAT, mucous membranes moist.   Cardiovascular: S1 S2 auscultated, no murmurs, RRR  Respiratory: Diminished but clear  Abdomen: Soft, nontender, nondistended, + bowel sounds  Extremities: warm dry without cyanosis clubbing or edema  Neuro: AAOx3, nonfocal  Psych: Flat affect  Data Review   Micro Results Recent Results (from the past 240 hour(s))  MRSA PCR Screening     Status: None   Collection Time: 01/10/16 12:49 PM  Result Value Ref Range Status   MRSA by PCR NEGATIVE NEGATIVE Final    Comment:        The GeneXpert MRSA Assay (FDA approved for NASAL specimens only), is one component of a comprehensive MRSA colonization surveillance program. It is not intended to diagnose MRSA infection nor to guide or monitor treatment for MRSA infections.     Radiology Reports Dg Chest Port 1 View  01/10/2016  CLINICAL DATA:  Shortness of breath. EXAM: PORTABLE CHEST 1 VIEW COMPARISON:  Radiographs 01/05/2016, CT 01/06/2016 FINDINGS: Heart at the upper limits normal in size. Perihilar pulmonary edema has progressed from prior exam. Bilateral pleural effusions, likely increased on the right in the interim. No pneumothorax.  Kyphoplasty noted in the upper thoracic spine. IMPRESSION: Progressive pulmonary edema and pleural effusions consistent with CHF. Electronically Signed   By: Jeb Levering M.D.   On: 01/10/2016 03:59    CBC  Recent Labs Lab 01/14/16 0216 01/18/16 0805  WBC 11.5* 13.2*  HGB 13.1 14.1  HCT 39.1 41.8  PLT 371 356  MCV 92.9 92.3  MCH 31.1 31.1  MCHC 33.5 33.7  RDW 14.7 14.3    Chemistries   Recent Labs Lab 01/13/16 0340 01/14/16 0216 01/15/16 0457 01/16/16 0556 01/17/16 0446  NA 129* 128* 129* 123* 124*  K 4.3 3.7 3.6  4.9 3.6  CL 91* 90* 90* 85* 84*  CO2 22 24 27 22 25   GLUCOSE 128* 116* 118* 107* 101*  BUN 32* 33* 33* 32* 23*  CREATININE 0.98 0.84 0.79 0.81 0.76  CALCIUM 9.0 8.7* 8.4* 8.6* 8.3*   ------------------------------------------------------------------------------------------------------------------ estimated creatinine clearance is 43.6 mL/min (by C-G formula based on Cr of 0.76). ------------------------------------------------------------------------------------------------------------------ No results for input(s): HGBA1C in the last 72 hours. ------------------------------------------------------------------------------------------------------------------ No results for input(s): CHOL, HDL, LDLCALC, TRIG, CHOLHDL, LDLDIRECT in the last 72 hours. ------------------------------------------------------------------------------------------------------------------ No results for input(s): TSH, T4TOTAL, T3FREE, THYROIDAB in the last 72 hours.  Invalid input(s): FREET3 ------------------------------------------------------------------------------------------------------------------ No results for input(s): VITAMINB12, FOLATE, FERRITIN, TIBC, IRON, RETICCTPCT in the last 72 hours.  Coagulation profile No results for input(s): INR, PROTIME in the last 168 hours.  No results for input(s): DDIMER in the last 72 hours.  Cardiac Enzymes No results for  input(s): CKMB, TROPONINI, MYOGLOBIN in the last 168 hours.  Invalid input(s): CK ------------------------------------------------------------------------------------------------------------------ Invalid input(s): POCBNP    Tahesha Skeet D.O. on 01/18/2016 at 2:19 PM  Between 7am to 7pm - Pager - [phone]  After 7pm go to [email or web address] - password [email or web address]  And look for the night coverage person covering for me after hours  [facility] Office  [phone]

## 2016-01-19 ENCOUNTER — Inpatient Hospital Stay (HOSPITAL_COMMUNITY): Payer: Medicare Other

## 2016-01-19 LAB — MAGNESIUM: MAGNESIUM: 2.1 mg/dL (ref 1.7–2.4)

## 2016-01-19 LAB — BASIC METABOLIC PANEL
Anion gap: 10 (ref 5–15)
BUN: 18 mg/dL (ref 6–20)
CALCIUM: 8.2 mg/dL — AB (ref 8.9–10.3)
CO2: 36 mmol/L — ABNORMAL HIGH (ref 22–32)
CREATININE: 0.7 mg/dL (ref 0.44–1.00)
Chloride: 89 mmol/L — ABNORMAL LOW (ref 101–111)
GFR calc non Af Amer: >60 mL/min (ref >60)
Glucose, Bld: 89 mg/dL (ref 65–99)
Potassium: 2.6 mmol/L — CL (ref 3.5–5.1)
SODIUM: 135 mmol/L (ref 135–145)

## 2016-01-19 LAB — CBC
HCT: 43.4 % (ref 36.0–46.0)
Hemoglobin: 14.4 g/dL (ref 12.0–15.0)
MCH: 30.8 pg (ref 26.0–34.0)
MCHC: 33.2 g/dL (ref 30.0–36.0)
MCV: 92.9 fL (ref 78.0–100.0)
PLATELETS: 361 10*3/uL (ref 150–400)
RBC: 4.67 MIL/uL (ref 3.87–5.11)
RDW: 14.4 % (ref 11.5–15.5)
WBC: 15.6 10*3/uL — AB (ref 4.0–10.5)

## 2016-01-19 MED ORDER — FUROSEMIDE 40 MG PO TABS
40.0000 mg | ORAL_TABLET | Freq: Two times a day (BID) | ORAL | Status: DC
  Administered 2016-01-19 – 2016-01-23 (×8): 40 mg via ORAL
  Filled 2016-01-19 (×8): qty 1

## 2016-01-19 MED ORDER — POTASSIUM CHLORIDE CRYS ER 20 MEQ PO TBCR
40.0000 meq | EXTENDED_RELEASE_TABLET | Freq: Two times a day (BID) | ORAL | Status: DC
  Filled 2016-01-19: qty 2

## 2016-01-19 MED ORDER — POTASSIUM CHLORIDE CRYS ER 20 MEQ PO TBCR
40.0000 meq | EXTENDED_RELEASE_TABLET | Freq: Three times a day (TID) | ORAL | Status: DC
  Administered 2016-01-19 – 2016-01-20 (×4): 40 meq via ORAL
  Filled 2016-01-19 (×3): qty 2

## 2016-01-19 NOTE — Care Management Important Message (Signed)
Important Message  Patient Details  Name: [name] [name] MRN: [id number] Date of Birth: 04/13/1932   Medicare Important Message Given:  Yes    Nou Chard, Leroy Sea 01/19/2016, 8:25 AM

## 2016-01-19 NOTE — Progress Notes (Signed)
Occupational Therapy Treatment Patient Details Name: [name] [name] MRN: [id number] DOB: September 01, 1932 Today's Date: 01/19/2016    History of present illness Patient is a 82 y/o female with hx of HTN, colon ca, anxiety, PNA and chronic systolic and diastolic CHF presents with SOB and A-fib. Recently admitted to [facility] with A-fib and d/ced from [facility] ~1 week ago.  Readmitted from rehab facility due to SOB, weakness and tx to [facility] per request of family. s/p cardioversion 2/6.   OT comments  Patient progressing slowly towards OT goals. Continue plan of care for now. Pt limited by increased fatigue. Pt with incontinence of bowels X3 during session. Pt seemed to act like she was straining when using bathroom during OT session, so unsure if this is true incontinence vs cognition? vs behavior? Encouraged pt to get up, with assistance, every couple hours to [facility] to try and use BR to decrease accidents.    Follow Up Recommendations  SNF;Supervision/Assistance - 24 hour    Equipment Recommendations  Other (comment) (TBD next venue of care)    Recommendations for Other Services  None at this time  Precautions / Restrictions Precautions Precautions: Fall Restrictions Weight Bearing Restrictions: No    Mobility Bed Mobility Overal bed mobility: Needs Assistance Bed Mobility: Rolling;Supine to Sit;Sit to Supine Rolling: Min assist   Supine to sit: Mod assist Sit to supine: Mod assist   General bed mobility comments: Min assist for rolling and VC to use bed rails. Pt required mod assist for trunk control into sitting EOB, then mod assist for BLE management for sit to supine. Pt fatigues quickly.   Transfers Overall transfer level: Needs assistance Equipment used: None (therapist standing in front of her) Transfers: Sit to/from [facility] Sit to Stand: Mod assist Stand pivot transfers: Mod assist General transfer comment: Mod A to move into standing and for  balance, mod assist for stand-pivot transition as well.     Balance Overall balance assessment: Needs assistance Sitting-balance support: No upper extremity supported;Feet supported Sitting balance-Leahy Scale: Fair     Standing balance support: During functional activity Standing balance-Leahy Scale: Poor   ADL Overall ADL's : Needs assistance/impaired Eating/Feeding: Set up;Sitting;Bed level   Grooming: Brushing hair;Minimal assistance;Sitting Grooming Details (indicate cue type and reason): Pt initially refused to brush hair and needed moderate encouragement  Upper Body Bathing: Minimal assitance;Sitting   Lower Body Bathing: Moderate assistance;Sit to/from stand   Upper Body Dressing : Moderate assistance;Sitting   Lower Body Dressing: Moderate assistance;Sit to/from stand   Toilet Transfer: Moderate assistance;Stand-pivot;BSC;RW   Toileting- Clothing Manipulation and Hygiene: Maximal assistance;Sitting/lateral lean         General ADL Comments: Pt continues to fatigue quickly. Pt requires moderate encouragement to complete tasks and continues to require min/mod assist for balance. Pt somewhat self-limiting. Pt having bowel accidents in bed, stating she can't tell she has to go. However, therapist witnessed patient straining like she was trying to have a BM. Therapist encouraged pt to call for assistance and sit on [facility] every couple hours since she's been having accidents. Pt with 3 accidents during this OT session.      Cognition   Behavior During Therapy: Flat affect (unsure if bowel incontience was true incontinence or cognition? vs behavior?) Overall Cognitive Status: No family/caregiver present to determine baseline cognitive functioning                 Pertinent Vitals/ Pain       Pain Assessment: No/denies  pain   Frequency Min 2X/week     Progress Toward Goals  OT Goals(current goals can now befound in the care plan section)  Progress towards OT goals:  Progressing toward goals  Acute Rehab OT Goals Patient Stated Goal: none stated OT Goal Formulation: With patient Time For Goal Achievement: 01/31/16 Potential to Achieve Goals: Good  Plan Discharge plan remains appropriate    End of Session   Activity Tolerance Patient limited by fatigue   Patient Left in bed;with call bell/phone within reach;with bed alarm set   Time: 1440-1510 OT Time Calculation (min): 30 min  Charges: OT General Charges $OT Visit: 1 Procedure OT Treatments $Self Care/Home Management : 23-37 mins  Chrys Racer , MS, OTR/L, CLT Pager: [phone]  01/19/2016, 3:40 PM

## 2016-01-19 NOTE — Progress Notes (Signed)
         Subjective: Feels better today. Had a lot of bowel discomfort yesterday. Better today post laxative.  Objective: Vital signs in last 24 hours: Temp:  [97.6 F (36.4 C)-97.9 F (36.6 C)] 97.6 F (36.4 C) (02/09 0627) Pulse Rate:  [63-68] 67 (02/09 1045) Resp:  [16] 16 (02/09 0627) BP: (109-123)/(50-59) 123/59 mmHg (02/09 1045) SpO2:  [91 %-95 %] 92 % (02/09 0627) Weight:  [57.289 kg (126 lb 4.8 oz)] 57.289 kg (126 lb 4.8 oz) (02/09 0627) Weight change: -2.011 kg (-4 lb 6.9 oz) Last BM Date: 01/19/16 Intake/Output from previous day: -1015 02/08 0701 - 02/09 0700 In: 1060 [P.O.:1060] Out: 400 [Urine:400] Intake/Output this shift: Total I/O In: 240 [P.O.:240] Out: -   PE: General:Pleasant affect but flat, NAD Skin:Warm and dry, brisk capillary refill HEENT:normocephalic, sclera clear, mucus membranes moist Heart:S1S2 RRR without murmur, gallup, rub or click Lungs:diminished with scant rales. [id number], non tender, + BS, do not palpate liver spleen or masses Ext:no lower ext edema, 2+ pedal pulses, 2+ radial pulses Neuro:alert and oriented X 3, MAE, follows commands, + facial symmetry Tele:  SR 1st degree AV block. occ PVCs. Short runs NSVT.   Lab Results:  Recent Labs  01/18/16 0805 01/19/16 0613  WBC 13.2* 15.6*  HGB 14.1 14.4  HCT 41.8 43.4  PLT 356 361   BMET  Recent Labs  01/17/16 0446 01/19/16 0613  NA 124* 135  K 3.6 2.6*  CL 84* 89*  CO2 25 36*  GLUCOSE 101* 89  BUN 23* 18  CREATININE 0.76 0.70  CALCIUM 8.3* 8.2*   No results for input(s): TROPONINI in the last 72 hours.  Invalid input(s): CK, MB  No results found for: CHOL, HDL, LDLCALC, LDLDIRECT, TRIG, CHOLHDL No results found for: HGBA1C   Lab Results  Component Value Date   TSH 0.906 01/07/2016      Studies/Results: No results found.  Medications: I have reviewed the patient's current medications. Scheduled Meds: . amiodarone  400 mg Oral BID  . apixaban  2.5 mg  Oral Q12H  . furosemide  40 mg Oral BID  . metoprolol tartrate  25 mg Oral BID  . pantoprazole  40 mg Oral Daily  . polyethylene glycol  17 g Oral Daily  . potassium chloride  40 mEq Oral TID   Continuous Infusions:  PRN Meds:.acetaminophen **OR** acetaminophen, LORazepam, menthol-cetylpyridinium, morphine injection, ondansetron, senna-docusate  Assessment/Plan:  1. Atrial fibrillation with RVR. S/p successful DCCV 01/16/16 on amiodarone. Maintaining NSR. transitioned amiodarone to po. 400 mg bid x 2 weeks then 400 mg daily. On Eliquis for anticoagulation. Mali vasc score of 5. Dose reduced due to age, weight.   2. Acute on chronic systolic and diastolic CHF. EF 40%. Weight down today to 126 which is her dry weight. CXR still shows significant edema and pleural effusions. Will convert lasix to po today 40 mg bid.   3. Hyponatremia. Probably due to CHF. Improved with diuresis and fluid restriction.  4. LBBB chronic  5. Weakness/ deconditioning.   6. Hypokalemia due to diuresis. Need to replete to therapeutic level.      LOS: 13 days   Peter [place], [place] 01/19/2016 10:54 AM

## 2016-01-19 NOTE — Discharge Planning (Signed)
BSW intern  spoke with MD about patient d/c. Patient will be d/c tomorrow. BSW intern notified patient family and [facility] facility. If a private room is unavailable tomorrow then the daughter will take the semi-private room at [facility]. DNR form is on patient chart and BSW intern has notified MD to sign form upon d/c. No further needs at this time.  BSW inter is signing off...  [place] intern  [phone]

## 2016-01-19 NOTE — Progress Notes (Signed)
PT Cancellation Note  Patient Details Name: [name] [name] MRN: [id number] DOB: [birth year]   Cancelled Treatment:    Reason Eval/Treat Not Completed: Medical issues which prohibited therapy (low potassium, no meds started yet)   Adama Ferber 01/19/2016, 9:54 AM

## 2016-01-19 NOTE — Progress Notes (Signed)
[facility]                                                                              Patient Demographics  [name] [name], is a 82 y.o. female, DOB - [birth year], [id number]  Admit date - 01/06/2016   Admitting Physician Janece Canterbury, MD  Outpatient Primary MD for the patient is VYAS,DHRUV B., MD  LOS - 13   No chief complaint on file.     HPI on 01/06/16 by Dr. Myrene Buddy [name] [name] is a 82 y.o. female with a past medical history significant for HTN and chronic systolic and diastolic CHF, but last EF 55% who presents with SOB and afib.  The patient was in her usual state of health until the beginning of this month when she developed dyspnea and cough, was observed overnight at [facility], treated for pneumonia and discharged. A week later, she developed chest discomfort and weakness, checked her HR on her BP cuff, found it to be elevated, went back to the hospital and was diagnosed with new onset Afib. She reports being "on a drip in the ICU" for five days, then converted to an oral agent (she thinks for HR control), seen by a Cardiologist who discussed cardioversion with her, and then being discharged to a rehab facility about a week ago.  Now, over the last week, she has had progressive weakness and shortness of breath when working with PT at rehabilitation. At night she also describes spells of vague chest discomfort that are intermittent and resolve with lorazepam. She has not had leg swelling, orthopnea, paroxysmal nocturnal dyspnea, frank chest pain, palpitations, or fever, chills, cough, dyspnea at rest, rigors, or sputum.  She was admitted to [facility] last night after a CT angiogram and chest x-ray suggested pulmonary edema without PE, and she was thought to have acute diastolic CHF. She ruled out with serial troponins overnight. Today, the patient's family requested transfer to [facility] because they were hoping to see new  doctors. On arrival, the patient was comfortable but in atrial fibrillation with rate 110-150 bpm.   Assessment & Plan   New Afib with RVR and aberrant conduction -CHADs2Vasc 5 (CHF, HTN, Age, gender).  -Cardiology consulted and appreciated -DCCV 01/11/16, however was back in Afib -Currently in NSR -Continue amiodarone, Eliquis  Acute renal failure -Resolved -Continue to monitor BMP  Acute combined systolic and diastolic CHF  -Old history of ischemic CM -Echocardiogram 01/09/16: EF 35-40% -Continue Lasix- transitioned to PO 40mg  BID today -Monitor intake/output, daily weights  LBBB -No previous records, but this appears to be chronic.  HTN and CAD -Stable  Hyponatremia -Resolved, In setting of Afib and resulting CHF -Continue to monitor BMP  Anxiety -Continue  lorazepam PRN  Leukocytosis -?reactive -Currently afebrile -Will obtain repeat CXR  Constipation -Resolved, continue bowel regimen -patient was given dulcolax supp on 2/8  Code Status: DNR  Family Communication: None at bedside.  Disposition Plan: Admitted. Will need SNF when ready for d/c, possibly in 24-48hours. Continue Lasix.  Time Spent in minutes   30 minutes  Procedures  Cardioversion Echocardiogram  Consults   Cardiology  DVT Prophylaxis  Eliquis  Lab Results  Component  Value Date   PLT 361 01/19/2016    Medications  Scheduled Meds: . amiodarone  400 mg Oral BID  . apixaban  2.5 mg Oral Q12H  . furosemide  40 mg Oral BID  . metoprolol tartrate  25 mg Oral BID  . pantoprazole  40 mg Oral Daily  . polyethylene glycol  17 g Oral Daily  . potassium chloride  40 mEq Oral TID   Continuous Infusions:  PRN Meds:.acetaminophen **OR** acetaminophen, LORazepam, menthol-cetylpyridinium, morphine injection, ondansetron, senna-docusate  Antibiotics    Anti-infectives    None     Subjective:   [name] [name] seen and examined today.  Patient feels better today.  She was able  to hae many bowel movements yesterday.  Denies chest pain, shortness of breath, palpitations, abdominal pain.    Objective:   Filed Vitals:   01/18/16 1151 01/18/16 2258 01/19/16 0627 01/19/16 1045  BP: 109/54 115/50 122/50 123/59  Pulse: 66 68 63 67  Temp: 97.9 F (36.6 C) 97.9 F (36.6 C) 97.6 F (36.4 C)   TempSrc: Oral Oral Oral   Resp: 16 16 16    Height:      Weight:   57.289 kg (126 lb 4.8 oz)   SpO2: 91% 95% 92%     Wt Readings from Last 3 Encounters:  01/19/16 57.289 kg (126 lb 4.8 oz)  08/20/15 56.7 kg (125 lb)  03/15/15 57.607 kg (127 lb)     Intake/Output Summary (Last 24 hours) at 01/19/16 1110 Last data filed at 01/19/16 [id number]  Gross per 24 hour  Intake    700 ml  Output    400 ml  Net    300 ml    Exam  General: Well developed, elderly, thin, NAD  HEENT: NCAT, mucous membranes moist.   Cardiovascular: S1 S2 auscultated, no murmurs, RRR  Respiratory: Diminished but clear  Abdomen: Soft, nontender, nondistended, + bowel sounds  Extremities: warm dry without cyanosis clubbing or edema  Neuro: AAOx3, nonfocal  Psych: Flat affect  Data Review   Micro Results Recent Results (from the past 240 hour(s))  MRSA PCR Screening     Status: None   Collection Time: 01/10/16 12:49 PM  Result Value Ref Range Status   MRSA by PCR NEGATIVE NEGATIVE Final    Comment:        The GeneXpert MRSA Assay (FDA approved for NASAL specimens only), is one component of a comprehensive MRSA colonization surveillance program. It is not intended to diagnose MRSA infection nor to guide or monitor treatment for MRSA infections.     Radiology Reports Dg Chest Port 1 View  01/10/2016  CLINICAL DATA:  Shortness of breath. EXAM: PORTABLE CHEST 1 VIEW COMPARISON:  Radiographs 01/05/2016, CT 01/06/2016 FINDINGS: Heart at the upper limits normal in size. Perihilar pulmonary edema has progressed from prior exam. Bilateral pleural effusions, likely increased on the right in  the interim. No pneumothorax. Kyphoplasty noted in the upper thoracic spine. IMPRESSION: Progressive pulmonary edema and pleural effusions consistent with CHF. Electronically Signed   By: Jeb Levering M.D.   On: 01/10/2016 03:59    CBC  Recent Labs Lab 01/14/16 0216 01/18/16 0805 01/19/16 0613  WBC 11.5* 13.2* 15.6*  HGB 13.1 14.1 14.4  HCT 39.1 41.8 43.4  PLT 371 356 361  MCV 92.9 92.3 92.9  MCH 31.1 31.1 30.8  MCHC 33.5 33.7 33.2  RDW 14.7 14.3 14.4    Chemistries   Recent Labs Lab 01/14/16 0216 01/15/16 0457 01/16/16 0556  01/17/16 0446 01/19/16 0613  NA 128* 129* 123* 124* 135  K 3.7 3.6 4.9 3.6 2.6*  CL 90* 90* 85* 84* 89*  CO2 24 27 22 25  36*  GLUCOSE 116* 118* 107* 101* 89  BUN 33* 33* 32* 23* 18  CREATININE 0.84 0.79 0.81 0.76 0.70  CALCIUM 8.7* 8.4* 8.6* 8.3* 8.2*   ------------------------------------------------------------------------------------------------------------------ estimated creatinine clearance is 43 mL/min (by C-G formula based on Cr of 0.7). ------------------------------------------------------------------------------------------------------------------ No results for input(s): HGBA1C in the last 72 hours. ------------------------------------------------------------------------------------------------------------------ No results for input(s): CHOL, HDL, LDLCALC, TRIG, CHOLHDL, LDLDIRECT in the last 72 hours. ------------------------------------------------------------------------------------------------------------------ No results for input(s): TSH, T4TOTAL, T3FREE, THYROIDAB in the last 72 hours.  Invalid input(s): FREET3 ------------------------------------------------------------------------------------------------------------------ No results for input(s): VITAMINB12, FOLATE, FERRITIN, TIBC, IRON, RETICCTPCT in the last 72 hours.  Coagulation profile No results for input(s): INR, PROTIME in the last 168 hours.  No results for  input(s): DDIMER in the last 72 hours.  Cardiac Enzymes No results for input(s): CKMB, TROPONINI, MYOGLOBIN in the last 168 hours.  Invalid input(s): CK ------------------------------------------------------------------------------------------------------------------ Invalid input(s): POCBNP    Wava Kildow D.O. on 01/19/2016 at 11:10 AM  Between 7am to 7pm - Pager - [phone]  After 7pm go to [email or web address] - password [email or web address]  And look for the night coverage person covering for me after hours  [facility] Office  [phone]

## 2016-01-20 ENCOUNTER — Inpatient Hospital Stay (HOSPITAL_COMMUNITY): Payer: Medicare Other

## 2016-01-20 DIAGNOSIS — J90 Pleural effusion, not elsewhere classified: Secondary | ICD-10-CM

## 2016-01-20 LAB — URINALYSIS, ROUTINE W REFLEX MICROSCOPIC
Bilirubin Urine: NEGATIVE
Glucose, UA: NEGATIVE mg/dL
KETONES UR: NEGATIVE mg/dL
NITRITE: POSITIVE — AB
PH: 7.5 (ref 5.0–8.0)
Protein, ur: NEGATIVE mg/dL
SPECIFIC GRAVITY, URINE: 1.014 (ref 1.005–1.030)

## 2016-01-20 LAB — BODY FLUID CELL COUNT WITH DIFFERENTIAL
Eos, Fluid: 0 %
LYMPHS FL: 76 %
Monocyte-Macrophage-Serous Fluid: 18 % — ABNORMAL LOW (ref 50–90)
NEUTROPHIL FLUID: 6 % (ref 0–25)
Total Nucleated Cell Count, Fluid: 246 cu mm (ref 0–1000)

## 2016-01-20 LAB — BASIC METABOLIC PANEL
ANION GAP: 10 (ref 5–15)
BUN: 21 mg/dL — AB (ref 6–20)
CHLORIDE: 89 mmol/L — AB (ref 101–111)
CO2: 37 mmol/L — ABNORMAL HIGH (ref 22–32)
Calcium: 8.4 mg/dL — ABNORMAL LOW (ref 8.9–10.3)
Creatinine, Ser: 0.76 mg/dL (ref 0.44–1.00)
Glucose, Bld: 57 mg/dL — ABNORMAL LOW (ref 65–99)
POTASSIUM: 4 mmol/L (ref 3.5–5.1)
SODIUM: 136 mmol/L (ref 135–145)

## 2016-01-20 LAB — URINE MICROSCOPIC-ADD ON

## 2016-01-20 LAB — LACTATE DEHYDROGENASE, PLEURAL OR PERITONEAL FLUID: LD, Fluid: 63 U/L — ABNORMAL HIGH (ref 3–23)

## 2016-01-20 LAB — GRAM STAIN

## 2016-01-20 LAB — PROTEIN, BODY FLUID: Total protein, fluid: <3 g/dL

## 2016-01-20 LAB — CBC
HEMATOCRIT: 42.7 % (ref 36.0–46.0)
HEMOGLOBIN: 13.6 g/dL (ref 12.0–15.0)
MCH: 30.1 pg (ref 26.0–34.0)
MCHC: 31.9 g/dL (ref 30.0–36.0)
MCV: 94.5 fL (ref 78.0–100.0)
Platelets: 406 10*3/uL — ABNORMAL HIGH (ref 150–400)
RBC: 4.52 MIL/uL (ref 3.87–5.11)
RDW: 14.6 % (ref 11.5–15.5)
WBC: 17.9 10*3/uL — AB (ref 4.0–10.5)

## 2016-01-20 LAB — GLUCOSE, SEROUS FLUID: Glucose, Fluid: 106 mg/dL

## 2016-01-20 MED ORDER — CIPROFLOXACIN IN D5W 400 MG/200ML IV SOLN
400.0000 mg | Freq: Two times a day (BID) | INTRAVENOUS | Status: DC
  Administered 2016-01-20 – 2016-01-23 (×6): 400 mg via INTRAVENOUS
  Filled 2016-01-20 (×6): qty 200

## 2016-01-20 MED ORDER — LIDOCAINE HCL (PF) 1 % IJ SOLN
INTRAMUSCULAR | Status: AC
  Filled 2016-01-20: qty 10

## 2016-01-20 MED ORDER — PRAMOXINE-ZINC OXIDE IN MO 1-12.5 % RE OINT
TOPICAL_OINTMENT | Freq: Three times a day (TID) | RECTAL | Status: DC | PRN
  Administered 2016-01-20 – 2016-01-21 (×2): via RECTAL
  Filled 2016-01-20: qty 28.3

## 2016-01-20 MED ORDER — POTASSIUM CHLORIDE CRYS ER 20 MEQ PO TBCR
40.0000 meq | EXTENDED_RELEASE_TABLET | Freq: Every day | ORAL | Status: DC
  Administered 2016-01-21 – 2016-01-23 (×3): 40 meq via ORAL
  Filled 2016-01-20 (×3): qty 2

## 2016-01-20 MED ORDER — BENZONATATE 100 MG PO CAPS
100.0000 mg | ORAL_CAPSULE | Freq: Three times a day (TID) | ORAL | Status: DC | PRN
  Administered 2016-01-20: 100 mg via ORAL
  Filled 2016-01-20: qty 1

## 2016-01-20 NOTE — Progress Notes (Signed)
Subjective: Feels better today. Mild cough.  Objective: Vital signs in last 24 hours: Temp:  [97.7 F (36.5 C)-98.4 F (36.9 C)] 98.4 F (36.9 C) (02/10 1237) Pulse Rate:  [61-68] 61 (02/10 1237) Resp:  [18] 18 (02/10 1237) BP: (112-122)/(53-59) 113/53 mmHg (02/10 1237) SpO2:  [92 %-96 %] 96 % (02/10 1237) Weight:  [57.017 kg (125 lb 11.2 oz)] 57.017 kg (125 lb 11.2 oz) (02/10 0358) Weight change: -0.272 kg (-9.6 oz) Last BM Date: 01/19/16 Intake/Output from previous day: -1015 02/09 0701 - 02/10 0700 In: 1040 [P.O.:1040] Out: -  Intake/Output this shift: Total I/O In: 840 [P.O.:840] Out: 80 [Urine:80]  PE: General:Pleasant affect but flat, NAD Skin:Warm and dry, brisk capillary refill HEENT:normocephalic, sclera clear, mucus membranes moist Heart:S1S2 RRR without murmur, gallup, rub or click Lungs: no rales or rhonchi. Decreased BS L>R [id number], non tender, + BS, do not palpate liver spleen or masses Ext:no lower ext edema, 2+ pedal pulses, 2+ radial pulses Neuro:alert and oriented X 3, MAE, follows commands, + facial symmetry Tele:  SR 1st degree AV block.    Lab Results:  Recent Labs  01/19/16 0613 01/20/16 0420  WBC 15.6* 17.9*  HGB 14.4 13.6  HCT 43.4 42.7  PLT 361 406*   BMET  Recent Labs  01/19/16 0613 01/20/16 0420  NA 135 136  K 2.6* 4.0  CL 89* 89*  CO2 36* 37*  GLUCOSE 89 57*  BUN 18 21*  CREATININE 0.70 0.76  CALCIUM 8.2* 8.4*   No results for input(s): TROPONINI in the last 72 hours.  Invalid input(s): CK, MB  No results found for: CHOL, HDL, LDLCALC, LDLDIRECT, TRIG, CHOLHDL No results found for: HGBA1C   Lab Results  Component Value Date   TSH 0.906 01/07/2016      Studies/Results: Dg Chest 1 View  01/20/2016  CLINICAL DATA:  Status post right thoracentesis. EXAM: CHEST 1 VIEW COMPARISON:  01/19/2016 FINDINGS: The cardiac silhouette is enlarged. Mediastinal contours appear intact. The aorta is torturous and  contains atherosclerotic calcifications There is no evidence of pneumothorax. There is decreased right pleural effusion. There is stable left pleural effusion. Findings of pulmonary edema are stable. There is left basilar atelectasis. Osseous structures are without acute abnormality. Kyphoplasty of the upper thoracic spine is noted. Soft tissues are grossly normal. IMPRESSION: No evidence of pneumothorax status post right thoracentesis, with decrease in the size of right pleural effusion. Persistent moderate in size left pleural effusion with left lung base atelectasis versus airspace consolidation. Electronically Signed   By: Fidela Salisbury M.D.   On: 01/20/2016 12:27   Dg Chest 2 View  01/19/2016  CLINICAL DATA:  Congestive heart failure.  Weakness. EXAM: CHEST  2 VIEW COMPARISON:  01/10/2016 FINDINGS: Large layering bilateral pleural effusions with passive atelectasis. Fluid tracks in the major fissures. There has been improvement in the prior pulmonary edema. Although the lung bases are obscured in the mid lungs are hazy due to the layering effusions, the vasculature in the upper zones is clearly more sharply defined and with less interstitial accentuation. Heart borders are somewhat obscured. Upper thoracic vertebral augmentations in the past. Degenerative glenohumeral arthropathy bilaterally. IMPRESSION: 1. Large bilateral pleural effusions with passive atelectasis. Resolving pulmonary edema, significantly improved from prior. Electronically Signed   By: Van Clines M.D.   On: 01/19/2016 14:01   US Thoracentesis Asp Pleural Space W/img Guide  01/20/2016  INDICATION: Patient with history of CHF, dyspnea, bilateral pleural  effusions, atrial fibrillation. Request is made for diagnostic and therapeutic right thoracentesis. EXAM: ULTRASOUND GUIDED DIAGNOSTIC AND THERAPEUTIC RIGHT THORACENTESIS MEDICATIONS: None. COMPLICATIONS: None immediate. PROCEDURE: An ultrasound guided thoracentesis was  thoroughly discussed with the patient and questions answered. The benefits, risks, alternatives and complications were also discussed. The patient understands and wishes to proceed with the procedure. Written consent was obtained. Ultrasound was performed to localize and mark an adequate pocket of fluid in the right chest. The area was then prepped and draped in the normal sterile fashion. 1% Lidocaine was used for local anesthesia. Under ultrasound guidance a Safe-T-Centesis catheter was introduced. Thoracentesis was performed. The catheter was removed and a dressing applied. FINDINGS: A total of approximately 1.2 liters of yellow fluid was removed. Samples were sent to the laboratory as requested by the clinical team. IMPRESSION: Successful ultrasound guided diagnostic and therapeutic right thoracentesis yielding 1.2 liters of pleural fluid. Read by: Rowe Robert, PA-C Electronically Signed   By: Sandi Mariscal M.D.   On: 01/20/2016 11:58    Medications: I have reviewed the patient's current medications. Scheduled Meds: . amiodarone  400 mg Oral BID  . apixaban  2.5 mg Oral Q12H  . ciprofloxacin  400 mg Intravenous Q12H  . furosemide  40 mg Oral BID  . lidocaine (PF)      . metoprolol tartrate  25 mg Oral BID  . pantoprazole  40 mg Oral Daily  . polyethylene glycol  17 g Oral Daily  . potassium chloride  40 mEq Oral TID   Continuous Infusions:  PRN Meds:.acetaminophen **OR** acetaminophen, LORazepam, menthol-cetylpyridinium, morphine injection, ondansetron, pramoxine-mineral oil-zinc, senna-docusate  Assessment/Plan:  1. Atrial fibrillation with RVR. S/p successful DCCV 01/16/16 on amiodarone. Maintaining NSR. transitioned amiodarone to po. 400 mg bid x 2 weeks then 400 mg daily. On Eliquis for anticoagulation. [place]i vasc score of 5. Dose reduced due to age, weight.   2. Acute on chronic systolic and diastolic CHF. EF 40%. Weight down today to 125 lbs which is her dry weight. I/O incomplete since  urine output not recorded. CXR still showed bilateral pleural effusions although pulmonary edema resolved. Continue  lasix po 40 mg bid. Patient is s/p right pleural tap today with 1.2 liters withdrawn. Good lung expansion on right. Still has residual left effusion. Fluid studies pending.  3. Hyponatremia. Probably due to CHF. Improved with diuresis and fluid restriction.  4. LBBB chronic  5. Weakness/ deconditioning.   6. Hypokalemia due to diuresis. Potassium normal today.     LOS: 14 days   Tytus Strahle [place], [place] 01/20/2016 3:34 PM

## 2016-01-20 NOTE — Progress Notes (Signed)
Patient's progress discussed with Dr. Ree Kida earlier today; states that patient was not stable for d/c today. Unsure about date of d/c  CSW spoke with Mardene Celeste, Admissions at [facility]- who indicated that they could accept patient over the weekend if medically stable.  CSW services will monitor and follow up with facility if d/c'd.  Lorie Phenix. Pauline Good, [place]

## 2016-01-20 NOTE — Progress Notes (Signed)
PT Cancellation Note  Patient Details Name: [name] [name] MRN: [id number] DOB: 01/06/1932   Cancelled Treatment:    Reason Eval/Treat Not Completed: Medical issues which prohibited therapy. Pt scheduled for thoracentesis this AM.  Pt politely declined therex due to not feeling well. Will check back.   Mallori Araque LUBECK 01/20/2016, 10:35 AM

## 2016-01-20 NOTE — Progress Notes (Signed)
[facility]                                                                              Patient Demographics  [name] [name], is a 82 y.o. female, DOB - 1932-05-24, [id number]  Admit date - 01/06/2016   Admitting Physician Janece Canterbury, MD  Outpatient Primary MD for the patient is VYAS,DHRUV B., MD  LOS - 14   No chief complaint on file.     HPI on 01/06/16 by Dr. Myrene Buddy NAVAH [name] [name] is a 82 y.o. female with a past medical history significant for HTN and chronic systolic and diastolic CHF, but last EF 55% who presents with SOB and afib.  The patient was in her usual state of health until the beginning of this month when she developed dyspnea and cough, was observed overnight at [facility], treated for pneumonia and discharged. A week later, she developed chest discomfort and weakness, checked her HR on her BP cuff, found it to be elevated, went back to the hospital and was diagnosed with new onset Afib. She reports being "on a drip in the ICU" for five days, then converted to an oral agent (she thinks for HR control), seen by a Cardiologist who discussed cardioversion with her, and then being discharged to a rehab facility about a week ago.  Now, over the last week, she has had progressive weakness and shortness of breath when working with PT at rehabilitation. At night she also describes spells of vague chest discomfort that are intermittent and resolve with lorazepam. She has not had leg swelling, orthopnea, paroxysmal nocturnal dyspnea, frank chest pain, palpitations, or fever, chills, cough, dyspnea at rest, rigors, or sputum.  She was admitted to [facility] last night after a CT angiogram and chest x-ray suggested pulmonary edema without PE, and she was thought to have acute diastolic CHF. She ruled out with serial troponins overnight. Today, the patient's family requested transfer to [name] because they were hoping to see new  doctors. On arrival, the patient was comfortable but in atrial fibrillation with rate 110-150 bpm.   Assessment & Plan   New Afib with RVR and aberrant conduction -CHADs2Vasc 5 (CHF, HTN, Age, gender).  -Cardiology consulted and appreciated -DCCV 01/11/16, however was back in Afib -Currently in NSR -Continue amiodarone, Eliquis  Acute renal failure -Resolved -Continue to monitor BMP  Acute combined systolic and diastolic CHF  -Old history of ischemic CM -Echocardiogram 01/09/16: EF 35-40% -Continue Lasix- transitioned to PO 40mg  BID today -Monitor intake/output, daily weights  B/L Pleural effusions -CXR 01/19/16: Large Bilateral pleural effusions with passive atelectasis, resolving pulmonary edema -IR consulted for ultrasound-guided thoracentesis -1.2 L fluid removed from the right, sent for labs  LBBB -No previous records, but this appears to be chronic.  HTN and CAD -Stable  Hyponatremia -Resolved, In setting of Afib and resulting CHF -Continue to monitor BMP  Anxiety -Continue  lorazepam PRN  Urinary tract infection/ Leukocytosis -Currently afebrile -Blood cultures pending -UA: + UTI  -Urine culture pending -Given PCN allergy and no history of using cephalosporin, will give ciprofloxacin  Constipation -Resolved, continue bowel regimen -patient was given dulcolax supp on 2/8  Code Status: DNR  Family Communication: None at bedside.  Disposition Plan: Admitted. Pending laboratory data from right thoracentesis,urine culture  Time Spent in minutes   30 minutes  Procedures  Cardioversion Echocardiogram Right US guided thoracentesis  Consults   Cardiology Interventional radiology  DVT Prophylaxis  Eliquis  Lab Results  Component Value Date   PLT 406* 01/20/2016    Medications  Scheduled Meds: . amiodarone  400 mg Oral BID  . apixaban  2.5 mg Oral Q12H  . furosemide  40 mg Oral BID  . lidocaine (PF)      . metoprolol tartrate  25 mg Oral BID   . pantoprazole  40 mg Oral Daily  . polyethylene glycol  17 g Oral Daily  . potassium chloride  40 mEq Oral TID   Continuous Infusions:  PRN Meds:.acetaminophen **OR** acetaminophen, LORazepam, menthol-cetylpyridinium, morphine injection, ondansetron, pramoxine-mineral oil-zinc, senna-docusate  Antibiotics    Anti-infectives    None     Subjective:   Franchot Gallo seen and examined today.  Denies chest pain, shortness of breath, palpitations, abdominal pain.  Patient continues to feel very weak.   Objective:   Filed Vitals:   01/20/16 0629 01/20/16 1132 01/20/16 1141 01/20/16 1237  BP: 122/59 112/58 116/59 113/53  Pulse: 68   61  Temp: 97.7 F (36.5 C)   98.4 F (36.9 C)  TempSrc: Oral   Oral  Resp: 18   18  Height:      Weight:      SpO2: 92%   96%    Wt Readings from Last 3 Encounters:  01/20/16 57.017 kg (125 lb 11.2 oz)  08/20/15 56.7 kg (125 lb)  03/15/15 57.607 kg (127 lb)     Intake/Output Summary (Last 24 hours) at 01/20/16 1313 Last data filed at 01/20/16 1104  Gross per 24 hour  Intake    820 ml  Output     80 ml  Net    740 ml    Exam  General: Well developed, elderly, thin, NAD  HEENT: NCAT, mucous membranes moist.   Cardiovascular: S1 S2 auscultated, no murmurs, RRR  Respiratory: Diminished but clear  Abdomen: Soft, nontender, nondistended, + bowel sounds  Extremities: warm dry without cyanosis clubbing or edema  Neuro: AAOx3, nonfocal  Psych: Flat affect, appropriate  Data Review   Micro Results No results found for this or any previous visit (from the past 240 hour(s)).  Radiology Reports Dg Chest 1 View  01/20/2016  CLINICAL DATA:  Status post right thoracentesis. EXAM: CHEST 1 VIEW COMPARISON:  01/19/2016 FINDINGS: The cardiac silhouette is enlarged. Mediastinal contours appear intact. The aorta is torturous and contains atherosclerotic calcifications There is no evidence of pneumothorax. There is decreased right  pleural effusion. There is stable left pleural effusion. Findings of pulmonary edema are stable. There is left basilar atelectasis. Osseous structures are without acute abnormality. Kyphoplasty of the upper thoracic spine is noted. Soft tissues are grossly normal. IMPRESSION: No evidence of pneumothorax status post right thoracentesis, with decrease in the size of right pleural effusion. Persistent moderate in size left pleural effusion with left lung base atelectasis versus airspace consolidation. Electronically Signed   By: Fidela Salisbury M.D.   On: 01/20/2016 12:27   Dg Chest 2 View  01/19/2016  CLINICAL DATA:  Congestive heart failure.  Weakness. EXAM: CHEST  2 VIEW COMPARISON:  01/10/2016 FINDINGS: Large layering bilateral pleural effusions with passive atelectasis. Fluid tracks in the major fissures. There has been improvement in the prior pulmonary edema. Although  the lung bases are obscured in the mid lungs are hazy due to the layering effusions, the vasculature in the upper zones is clearly more sharply defined and with less interstitial accentuation. Heart borders are somewhat obscured. Upper thoracic vertebral augmentations in the past. Degenerative glenohumeral arthropathy bilaterally. IMPRESSION: 1. Large bilateral pleural effusions with passive atelectasis. Resolving pulmonary edema, significantly improved from prior. Electronically Signed   By: Van Clines M.D.   On: 01/19/2016 14:01   Dg Chest Port 1 View  01/10/2016  CLINICAL DATA:  Shortness of breath. EXAM: PORTABLE CHEST 1 VIEW COMPARISON:  Radiographs 01/05/2016, CT 01/06/2016 FINDINGS: Heart at the upper limits normal in size. Perihilar pulmonary edema has progressed from prior exam. Bilateral pleural effusions, likely increased on the right in the interim. No pneumothorax. Kyphoplasty noted in the upper thoracic spine. IMPRESSION: Progressive pulmonary edema and pleural effusions consistent with CHF. Electronically Signed    By: Jeb Levering M.D.   On: 01/10/2016 03:59    CBC  Recent Labs Lab 01/14/16 0216 01/18/16 0805 01/19/16 0613 01/20/16 0420  WBC 11.5* 13.2* 15.6* 17.9*  HGB 13.1 14.1 14.4 13.6  HCT 39.1 41.8 43.4 42.7  PLT 371 356 361 406*  MCV 92.9 92.3 92.9 94.5  MCH 31.1 31.1 30.8 30.1  MCHC 33.5 33.7 33.2 31.9  RDW 14.7 14.3 14.4 14.6    Chemistries   Recent Labs Lab 01/15/16 0457 01/16/16 0556 01/17/16 0446 01/19/16 0613 01/20/16 0420  NA 129* 123* 124* 135 136  K 3.6 4.9 3.6 2.6* 4.0  CL 90* 85* 84* 89* 89*  CO2 27 22 25  36* 37*  GLUCOSE 118* 107* 101* 89 57*  BUN 33* 32* 23* 18 21*  CREATININE 0.79 0.81 0.76 0.70 0.76  CALCIUM 8.4* 8.6* 8.3* 8.2* 8.4*  MG  --   --   --  2.1  --    ------------------------------------------------------------------------------------------------------------------ estimated creatinine clearance is 42.9 mL/min (by C-G formula based on Cr of 0.76). ------------------------------------------------------------------------------------------------------------------ No results for input(s): HGBA1C in the last 72 hours. ------------------------------------------------------------------------------------------------------------------ No results for input(s): CHOL, HDL, LDLCALC, TRIG, CHOLHDL, LDLDIRECT in the last 72 hours. ------------------------------------------------------------------------------------------------------------------ No results for input(s): TSH, T4TOTAL, T3FREE, THYROIDAB in the last 72 hours.  Invalid input(s): FREET3 ------------------------------------------------------------------------------------------------------------------ No results for input(s): VITAMINB12, FOLATE, FERRITIN, TIBC, IRON, RETICCTPCT in the last 72 hours.  Coagulation profile No results for input(s): INR, PROTIME in the last 168 hours.  No results for input(s): DDIMER in the last 72 hours.  Cardiac Enzymes No results for input(s): CKMB,  TROPONINI, MYOGLOBIN in the last 168 hours.  Invalid input(s): CK ------------------------------------------------------------------------------------------------------------------ Invalid input(s): POCBNP    Shetara Launer D.O. on 01/20/2016 at 1:13 PM  Between 7am to 7pm - Pager - [phone]  After 7pm go to [email or web address] - password [email or web address]  And look for the night coverage person covering for me after hours  [facility] Office  [phone]

## 2016-01-20 NOTE — Progress Notes (Signed)
Patient is refusing to wear BIPAP at this time. Patient states she does not wear one at home and did not want it here. RT educated patient and told her to have RN contact RT if she changes her mind.

## 2016-01-20 NOTE — Procedures (Signed)
Ultrasound-guided diagnostic and therapeutic right thoracentesis performed yielding 1.2 liters of yellow  fluid. No immediate complications. Follow-up chest x-ray pending. The fluid was sent to the lab for preordered studies.       

## 2016-01-21 LAB — BASIC METABOLIC PANEL
Anion gap: 9 (ref 5–15)
BUN: 19 mg/dL (ref 6–20)
CALCIUM: 8.1 mg/dL — AB (ref 8.9–10.3)
CO2: 32 mmol/L (ref 22–32)
Chloride: 91 mmol/L — ABNORMAL LOW (ref 101–111)
Creatinine, Ser: 0.7 mg/dL (ref 0.44–1.00)
GFR calc Af Amer: >60 mL/min (ref >60)
GLUCOSE: 76 mg/dL (ref 65–99)
Potassium: 3.5 mmol/L (ref 3.5–5.1)
Sodium: 132 mmol/L — ABNORMAL LOW (ref 135–145)

## 2016-01-21 LAB — CBC
HEMATOCRIT: 41 % (ref 36.0–46.0)
Hemoglobin: 13.2 g/dL (ref 12.0–15.0)
MCH: 30 pg (ref 26.0–34.0)
MCHC: 32.2 g/dL (ref 30.0–36.0)
MCV: 93.2 fL (ref 78.0–100.0)
Platelets: 389 10*3/uL (ref 150–400)
RBC: 4.4 MIL/uL (ref 3.87–5.11)
RDW: 14.6 % (ref 11.5–15.5)
WBC: 17.2 10*3/uL — ABNORMAL HIGH (ref 4.0–10.5)

## 2016-01-21 LAB — TRIGLYCERIDES, BODY FLUIDS: Triglycerides, Fluid: 9 mg/dL

## 2016-01-21 NOTE — Progress Notes (Signed)
Maintaining sinus rhythm  Cardiology to see as needed over the weekend Please call with questions  Thompson Grayer MD, [facility] 01/21/2016 2:37 PM

## 2016-01-21 NOTE — Progress Notes (Signed)
[facility]                                                                              Patient Demographics  [name] [name], is a 82 y.o. female, DOB - Feb 07, 1932, [id number]  Admit date - 01/06/2016   Admitting Physician Janece Canterbury, MD  Outpatient Primary MD for the patient is VYAS,DHRUV B., MD  LOS - 15   No chief complaint on file.     HPI on 01/06/16 by Dr. Myrene Buddy [name] [name] is a 82 y.o. female with a past medical history significant for HTN and chronic systolic and diastolic CHF, but last EF 55% who presents with SOB and afib.  The patient was in her usual state of health until the beginning of this month when she developed dyspnea and cough, was observed overnight at [facility], treated for pneumonia and discharged. A week later, she developed chest discomfort and weakness, checked her HR on her BP cuff, found it to be elevated, went back to the hospital and was diagnosed with new onset Afib. She reports being "on a drip in the ICU" for five days, then converted to an oral agent (she thinks for HR control), seen by a Cardiologist who discussed cardioversion with her, and then being discharged to a rehab facility about a week ago.  Now, over the last week, she has had progressive weakness and shortness of breath when working with PT at rehabilitation. At night she also describes spells of vague chest discomfort that are intermittent and resolve with lorazepam. She has not had leg swelling, orthopnea, paroxysmal nocturnal dyspnea, frank chest pain, palpitations, or fever, chills, cough, dyspnea at rest, rigors, or sputum.  She was admitted to [facility] last night after a CT angiogram and chest x-ray suggested pulmonary edema without PE, and she was thought to have acute diastolic CHF. She ruled out with serial troponins overnight. Today, the patient's family requested transfer to [facility] because they were hoping to see new  doctors. On arrival, the patient was comfortable but in atrial fibrillation with rate 110-150 bpm.   Assessment & Plan   New Afib with RVR and aberrant conduction -CHADs2Vasc 5 (CHF, HTN, Age, gender).  -Cardiology consulted and appreciated -DCCV 01/11/16, however was back in Afib -Currently in NSR -Continue amiodarone, Eliquis  Acute renal failure -Resolved -Continue to monitor BMP  Acute combined systolic and diastolic CHF  -Old history of ischemic CM -Echocardiogram 01/09/16: EF 35-40% -Continue Lasix- transitioned to PO 40mg  BID  -Monitor intake/output, daily weights  B/L Pleural effusions -CXR 01/19/16: Large Bilateral pleural effusions with passive atelectasis, resolving pulmonary edema -IR consulted for ultrasound-guided thoracentesis -1.2 L fluid removed from the right, sent for labs- no organisms seen  LBBB -No previous records, but this appears to be chronic.  HTN and CAD -Stable  Hyponatremia -In setting of Afib and resulting CHF -Continue to monitor BMP  Anxiety -Continue lorazepam PRN  Urinary tract infection/ Leukocytosis -Currently afebrile -Blood cultures show no growth to date -UA: + UTI  -Urine culture pending -Given PCN allergy and no history of using cephalosporin, continue ciprofloxacin  Constipation -Resolved, continue bowel regimen -patient was given dulcolax supp on 2/8  Code Status: DNR  Family Communication: None at bedside.  Disposition Plan: Admitted. Pending urine culture. Suspect discharge to SNF within 24-48hrs.   Time Spent in minutes   30 minutes  Procedures  Cardioversion Echocardiogram Right US guided thoracentesis  Consults   Cardiology Interventional radiology  DVT Prophylaxis  Eliquis  Lab Results  Component Value Date   PLT 389 01/21/2016    Medications  Scheduled Meds: . amiodarone  400 mg Oral BID  . apixaban  2.5 mg Oral Q12H  . ciprofloxacin  400 mg Intravenous Q12H  . furosemide  40 mg Oral BID    . metoprolol tartrate  25 mg Oral BID  . pantoprazole  40 mg Oral Daily  . polyethylene glycol  17 g Oral Daily  . potassium chloride  40 mEq Oral Daily   Continuous Infusions:  PRN Meds:.acetaminophen **OR** acetaminophen, benzonatate, LORazepam, menthol-cetylpyridinium, morphine injection, ondansetron, pramoxine-mineral oil-zinc, senna-docusate  Antibiotics    Anti-infectives    Start     Dose/Rate Route Frequency Ordered Stop   01/20/16 1400  ciprofloxacin (CIPRO) IVPB 400 mg     400 mg 200 mL/hr over 60 Minutes Intravenous Every 12 hours 01/20/16 1319       Subjective:   [name] [name] seen and examined today.  Patient has no new complaints today.  Still feels weak and has hemorrhoidal pain.  Denies chest pain, shortness of breath.   Objective:   Filed Vitals:   01/20/16 1237 01/20/16 1947 01/21/16 0434 01/21/16 0909  BP: 113/53 126/62 104/49 103/67  Pulse: 61 66 68 64  Temp: 98.4 F (36.9 C) 98 F (36.7 C) 98 F (36.7 C) 98 F (36.7 C)  TempSrc: Oral Axillary Axillary   Resp: 18 18 18    Height:      Weight:   56.8 kg (125 lb 3.5 oz)   SpO2: 96% 92% 96% 96%    Wt Readings from Last 3 Encounters:  01/21/16 56.8 kg (125 lb 3.5 oz)  08/20/15 56.7 kg (125 lb)  03/15/15 57.607 kg (127 lb)     Intake/Output Summary (Last 24 hours) at 01/21/16 1111 Last data filed at 01/21/16 0919  Gross per 24 hour  Intake   1780 ml  Output      0 ml  Net   1780 ml    Exam  General: Well developed, elderly, thin, NAD  HEENT: NCAT, mucous membranes moist.   Cardiovascular: S1 S2 auscultated, no murmurs, RRR  Respiratory: Diminished but clear  Abdomen: Soft, nontender, nondistended, + bowel sounds  Extremities: warm dry without cyanosis clubbing or edema  Neuro: AAOx3, nonfocal  Psych: Appropriate  Data Review   Micro Results Recent Results (from the past 240 hour(s))  Culture, blood (routine x 2)     Status: None (Preliminary result)   Collection Time:  01/20/16  7:58 AM  Result Value Ref Range Status   Specimen Description BLOOD LEFT ANTECUBITAL  Final   Special Requests IN PEDIATRIC BOTTLE 3CC  Final   Culture NO GROWTH 1 DAY  Final   Report Status PENDING  Incomplete  Culture, blood (routine x 2)     Status: None (Preliminary result)   Collection Time: 01/20/16  8:02 AM  Result Value Ref Range Status   Specimen Description BLOOD RIGHT ANTECUBITAL  Final   Special Requests BOTTLES DRAWN AEROBIC ONLY 5CC  Final   Culture NO GROWTH 1 DAY  Final   Report Status PENDING  Incomplete  Culture, body fluid-bottle  Status: None (Preliminary result)   Collection Time: 01/20/16 11:36 AM  Result Value Ref Range Status   Specimen Description FLUID RIGHT PLEURAL  Final   Special Requests BOTTLES DRAWN AEROBIC AND ANAEROBIC 10MLS  Final   Culture NO GROWTH < 24 HOURS  Final   Report Status PENDING  Incomplete  Gram stain     Status: None   Collection Time: 01/20/16 11:36 AM  Result Value Ref Range Status   Specimen Description FLUID RIGHT PLEURAL  Final   Special Requests NONE  Final   Gram Stain   Final    ABUNDANT WBC PRESENT,BOTH PMN AND MONONUCLEAR NO ORGANISMS SEEN    Report Status 01/20/2016 FINAL  Final    Radiology Reports Dg Chest 1 View  01/20/2016  CLINICAL DATA:  Status post right thoracentesis. EXAM: CHEST 1 VIEW COMPARISON:  01/19/2016 FINDINGS: The cardiac silhouette is enlarged. Mediastinal contours appear intact. The aorta is torturous and contains atherosclerotic calcifications There is no evidence of pneumothorax. There is decreased right pleural effusion. There is stable left pleural effusion. Findings of pulmonary edema are stable. There is left basilar atelectasis. Osseous structures are without acute abnormality. Kyphoplasty of the upper thoracic spine is noted. Soft tissues are grossly normal. IMPRESSION: No evidence of pneumothorax status post right thoracentesis, with decrease in the size of right pleural effusion.  Persistent moderate in size left pleural effusion with left lung base atelectasis versus airspace consolidation. Electronically Signed   By: Fidela Salisbury M.D.   On: 01/20/2016 12:27   Dg Chest 2 View  01/19/2016  CLINICAL DATA:  Congestive heart failure.  Weakness. EXAM: CHEST  2 VIEW COMPARISON:  01/10/2016 FINDINGS: Large layering bilateral pleural effusions with passive atelectasis. Fluid tracks in the major fissures. There has been improvement in the prior pulmonary edema. Although the lung bases are obscured in the mid lungs are hazy due to the layering effusions, the vasculature in the upper zones is clearly more sharply defined and with less interstitial accentuation. Heart borders are somewhat obscured. Upper thoracic vertebral augmentations in the past. Degenerative glenohumeral arthropathy bilaterally. IMPRESSION: 1. Large bilateral pleural effusions with passive atelectasis. Resolving pulmonary edema, significantly improved from prior. Electronically Signed   By: Van Clines M.D.   On: 01/19/2016 14:01   Dg Chest Port 1 View  01/10/2016  CLINICAL DATA:  Shortness of breath. EXAM: PORTABLE CHEST 1 VIEW COMPARISON:  Radiographs 01/05/2016, CT 01/06/2016 FINDINGS: Heart at the upper limits normal in size. Perihilar pulmonary edema has progressed from prior exam. Bilateral pleural effusions, likely increased on the right in the interim. No pneumothorax. Kyphoplasty noted in the upper thoracic spine. IMPRESSION: Progressive pulmonary edema and pleural effusions consistent with CHF. Electronically Signed   By: Jeb Levering M.D.   On: 01/10/2016 03:59   US Thoracentesis Asp Pleural Space W/img Guide  01/20/2016  INDICATION: Patient with history of CHF, dyspnea, bilateral pleural effusions, atrial fibrillation. Request is made for diagnostic and therapeutic right thoracentesis. EXAM: ULTRASOUND GUIDED DIAGNOSTIC AND THERAPEUTIC RIGHT THORACENTESIS MEDICATIONS: None. COMPLICATIONS: None  immediate. PROCEDURE: An ultrasound guided thoracentesis was thoroughly discussed with the patient and questions answered. The benefits, risks, alternatives and complications were also discussed. The patient understands and wishes to proceed with the procedure. Written consent was obtained. Ultrasound was performed to localize and mark an adequate pocket of fluid in the right chest. The area was then prepped and draped in the normal sterile fashion. 1% Lidocaine was used for local anesthesia. Under ultrasound guidance a  Safe-T-Centesis catheter was introduced. Thoracentesis was performed. The catheter was removed and a dressing applied. FINDINGS: A total of approximately 1.2 liters of yellow fluid was removed. Samples were sent to the laboratory as requested by the clinical team. IMPRESSION: Successful ultrasound guided diagnostic and therapeutic right thoracentesis yielding 1.2 liters of pleural fluid. Read by: Rowe Robert, PA-C Electronically Signed   By: Sandi Mariscal M.D.   On: 01/20/2016 11:58    CBC  Recent Labs Lab 01/18/16 0805 01/19/16 0613 01/20/16 0420 01/21/16 0337  WBC 13.2* 15.6* 17.9* 17.2*  HGB 14.1 14.4 13.6 13.2  HCT 41.8 43.4 42.7 41.0  PLT 356 361 406* 389  MCV 92.3 92.9 94.5 93.2  MCH 31.1 30.8 30.1 30.0  MCHC 33.7 33.2 31.9 32.2  RDW 14.3 14.4 14.6 14.6    Chemistries   Recent Labs Lab 01/16/16 0556 01/17/16 0446 01/19/16 0613 01/20/16 0420 01/21/16 0337  NA 123* 124* 135 136 132*  K 4.9 3.6 2.6* 4.0 3.5  CL 85* 84* 89* 89* 91*  CO2 22 25 36* 37* 32  GLUCOSE 107* 101* 89 57* 76  BUN 32* 23* 18 21* 19  CREATININE 0.81 0.76 0.70 0.76 0.70  CALCIUM 8.6* 8.3* 8.2* 8.4* 8.1*  MG  --   --  2.1  --   --    ------------------------------------------------------------------------------------------------------------------ estimated creatinine clearance is 42.8 mL/min (by C-G formula based on Cr of  0.7). ------------------------------------------------------------------------------------------------------------------ No results for input(s): HGBA1C in the last 72 hours. ------------------------------------------------------------------------------------------------------------------ No results for input(s): CHOL, HDL, LDLCALC, TRIG, CHOLHDL, LDLDIRECT in the last 72 hours. ------------------------------------------------------------------------------------------------------------------ No results for input(s): TSH, T4TOTAL, T3FREE, THYROIDAB in the last 72 hours.  Invalid input(s): FREET3 ------------------------------------------------------------------------------------------------------------------ No results for input(s): VITAMINB12, FOLATE, FERRITIN, TIBC, IRON, RETICCTPCT in the last 72 hours.  Coagulation profile No results for input(s): INR, PROTIME in the last 168 hours.  No results for input(s): DDIMER in the last 72 hours.  Cardiac Enzymes No results for input(s): CKMB, TROPONINI, MYOGLOBIN in the last 168 hours.  Invalid input(s): CK ------------------------------------------------------------------------------------------------------------------ Invalid input(s): POCBNP    Monico Sudduth D.O. on 01/21/2016 at 11:11 AM  Between 7am to 7pm - Pager - [phone]  After 7pm go to [email or web address] - password [email or web address]  And look for the night coverage person covering for me after hours  [facility] Office  [phone]

## 2016-01-22 LAB — CBC
HEMATOCRIT: 39.4 % (ref 36.0–46.0)
Hemoglobin: 12.4 g/dL (ref 12.0–15.0)
MCH: 29.5 pg (ref 26.0–34.0)
MCHC: 31.5 g/dL (ref 30.0–36.0)
MCV: 93.8 fL (ref 78.0–100.0)
PLATELETS: 364 10*3/uL (ref 150–400)
RBC: 4.2 MIL/uL (ref 3.87–5.11)
RDW: 14.5 % (ref 11.5–15.5)
WBC: 14.9 10*3/uL — AB (ref 4.0–10.5)

## 2016-01-22 LAB — BASIC METABOLIC PANEL
Anion gap: 9 (ref 5–15)
BUN: 16 mg/dL (ref 6–20)
CALCIUM: 8.2 mg/dL — AB (ref 8.9–10.3)
CO2: 32 mmol/L (ref 22–32)
CREATININE: 0.73 mg/dL (ref 0.44–1.00)
Chloride: 95 mmol/L — ABNORMAL LOW (ref 101–111)
GFR calc Af Amer: >60 mL/min (ref >60)
GLUCOSE: 88 mg/dL (ref 65–99)
POTASSIUM: 3.7 mmol/L (ref 3.5–5.1)
SODIUM: 136 mmol/L (ref 135–145)

## 2016-01-22 LAB — URINE CULTURE

## 2016-01-22 NOTE — Progress Notes (Signed)
[facility]                                                                              Patient Demographics  [name] [name], is a 82 y.o. female, DOB - January 18, 1932, [id number]  Admit date - 01/06/2016   Admitting Physician Janece Canterbury, MD  Outpatient Primary MD for the patient is VYAS,DHRUV B., MD  LOS - 16   No chief complaint on file.     HPI on 01/06/16 by Dr. Myrene Buddy [name] [name] is a 82 y.o. female with a past medical history significant for HTN and chronic systolic and diastolic CHF, but last EF 55% who presents with SOB and afib.  The patient was in her usual state of health until the beginning of this month when she developed dyspnea and cough, was observed overnight at [facility], treated for pneumonia and discharged. A week later, she developed chest discomfort and weakness, checked her HR on her BP cuff, found it to be elevated, went back to the hospital and was diagnosed with new onset Afib. She reports being "on a drip in the ICU" for five days, then converted to an oral agent (she thinks for HR control), seen by a Cardiologist who discussed cardioversion with her, and then being discharged to a rehab facility about a week ago.  Now, over the last week, she has had progressive weakness and shortness of breath when working with PT at rehabilitation. At night she also describes spells of vague chest discomfort that are intermittent and resolve with lorazepam. She has not had leg swelling, orthopnea, paroxysmal nocturnal dyspnea, frank chest pain, palpitations, or fever, chills, cough, dyspnea at rest, rigors, or sputum.  She was admitted to [facility] last night after a CT angiogram and chest x-ray suggested pulmonary edema without PE, and she was thought to have acute diastolic CHF. She ruled out with serial troponins overnight. Today, the patient's family requested transfer to [facility] because they were hoping to see new  doctors. On arrival, the patient was comfortable but in atrial fibrillation with rate 110-150 bpm.   Assessment & Plan   New Afib with RVR and aberrant conduction -CHADs2Vasc 5 (CHF, HTN, Age, gender).  -Cardiology consulted and appreciated -DCCV 01/11/16, however was back in Afib -Currently in NSR -Continue amiodarone, Eliquis  Acute renal failure -Resolved -Continue to monitor BMP  Acute combined systolic and diastolic CHF  -Old history of ischemic CM -Echocardiogram 01/09/16: EF 35-40% -Continue Lasix- transitioned to PO 40mg  BID  -Monitor intake/output, daily weights  B/L Pleural effusions -CXR 01/19/16: Large Bilateral pleural effusions with passive atelectasis, resolving pulmonary edema -IR consulted for ultrasound-guided thoracentesis -1.2 L fluid removed from the right, sent for labs- no organisms seen  LBBB -No previous records, but this appears to be chronic.  HTN and CAD -Stable  Hyponatremia -Resolved, In setting of Afib and resulting CHF -Continue to monitor BMP  Anxiety -Continue lorazepam PRN  Urinary tract infection/ Leukocytosis -Currently afebrile -Blood cultures show no growth to date -UA: + UTI  -Urine culture >100K enterococcus  -Given PCN allergy and no history of using cephalosporin, continue ciprofloxacin -leukocytosis improving   Constipation/Diarrhea -patient was given dulcolax supp  on 2/8 as patient stated she did not have a BM in over 1 week -Discontinued miralax, colace -Now patient states she is having diarrhea  Hemorrhoidal pain -Continue TUCKS  Code Status: DNR  Family Communication: None at bedside.  Disposition Plan: Admitted.   Time Spent in minutes   30 minutes  Procedures  Cardioversion Echocardiogram Right US guided thoracentesis  Consults   Cardiology Interventional radiology  DVT Prophylaxis  Eliquis  Lab Results  Component Value Date   PLT 364 01/22/2016    Medications  Scheduled Meds: .  amiodarone  400 mg Oral BID  . apixaban  2.5 mg Oral Q12H  . ciprofloxacin  400 mg Intravenous Q12H  . furosemide  40 mg Oral BID  . metoprolol tartrate  25 mg Oral BID  . pantoprazole  40 mg Oral Daily  . potassium chloride  40 mEq Oral Daily   Continuous Infusions:  PRN Meds:.acetaminophen **OR** acetaminophen, benzonatate, LORazepam, menthol-cetylpyridinium, morphine injection, ondansetron, pramoxine-mineral oil-zinc, senna-docusate  Antibiotics    Anti-infectives    Start     Dose/Rate Route Frequency Ordered Stop   01/20/16 1400  ciprofloxacin (CIPRO) IVPB 400 mg     400 mg 200 mL/hr over 60 Minutes Intravenous Every 12 hours 01/20/16 1319       Subjective:   Franchot Gallo seen and examined today.   Continues to complain of hemorrhoidal pain and feeling weak. Denies chest pain, shortness of breath, abdominal pain.    Objective:   Filed Vitals:   01/21/16 1241 01/21/16 1940 01/22/16 0406 01/22/16 1000  BP: 100/53 118/56 123/49 116/60  Pulse: 66 66 61   Temp: 97.4 F (36.3 C) 98 F (36.7 C) 97.6 F (36.4 C)   TempSrc: Oral Oral Oral   Resp: 20 18 18    Height:      Weight:   56.6 kg (124 lb 12.5 oz)   SpO2: 92% 98% 98%     Wt Readings from Last 3 Encounters:  01/22/16 56.6 kg (124 lb 12.5 oz)  08/20/15 56.7 kg (125 lb)  03/15/15 57.607 kg (127 lb)     Intake/Output Summary (Last 24 hours) at 01/22/16 1141 Last data filed at 01/22/16 0916  Gross per 24 hour  Intake    920 ml  Output    300 ml  Net    620 ml    Exam  General: Well developed, elderly, thin, NAD  HEENT: NCAT, mucous membranes moist.   Cardiovascular: S1 S2 auscultated, no murmurs, RRR  Respiratory: Diminished but clear  Abdomen: Soft, nontender, nondistended, + bowel sounds  Extremities: warm dry without cyanosis clubbing or edema  Neuro: AAOx3, nonfocal  Psych: Flat affect  Data Review   Micro Results Recent Results (from the past 240 hour(s))  Culture, blood (routine  x 2)     Status: None (Preliminary result)   Collection Time: 01/20/16  7:58 AM  Result Value Ref Range Status   Specimen Description BLOOD LEFT ANTECUBITAL  Final   Special Requests IN PEDIATRIC BOTTLE 3CC  Final   Culture NO GROWTH 2 DAYS  Final   Report Status PENDING  Incomplete  Culture, blood (routine x 2)     Status: None (Preliminary result)   Collection Time: 01/20/16  8:02 AM  Result Value Ref Range Status   Specimen Description BLOOD RIGHT ANTECUBITAL  Final   Special Requests BOTTLES DRAWN AEROBIC ONLY 5CC  Final   Culture NO GROWTH 2 DAYS  Final   Report Status PENDING  Incomplete  Culture, body fluid-bottle     Status: None (Preliminary result)   Collection Time: 01/20/16 11:36 AM  Result Value Ref Range Status   Specimen Description FLUID RIGHT PLEURAL  Final   Special Requests BOTTLES DRAWN AEROBIC AND ANAEROBIC 10MLS  Final   Culture NO GROWTH 2 DAYS  Final   Report Status PENDING  Incomplete  Gram stain     Status: None   Collection Time: 01/20/16 11:36 AM  Result Value Ref Range Status   Specimen Description FLUID RIGHT PLEURAL  Final   Special Requests NONE  Final   Gram Stain   Final    ABUNDANT WBC PRESENT,BOTH PMN AND MONONUCLEAR NO ORGANISMS SEEN    Report Status 01/20/2016 FINAL  Final  Culture, Urine     Status: None   Collection Time: 01/20/16  1:17 PM  Result Value Ref Range Status   Specimen Description URINE, CLEAN CATCH  Final   Special Requests NONE  Final   Culture >=100,000 COLONIES/mL ENTEROCOCCUS SPECIES  Final   Report Status 01/22/2016 FINAL  Final   Organism ID, Bacteria ENTEROCOCCUS SPECIES  Final      Susceptibility   Enterococcus species - MIC*    AMPICILLIN <=2 SENSITIVE Sensitive     LEVOFLOXACIN 1 SENSITIVE Sensitive     NITROFURANTOIN <=16 SENSITIVE Sensitive     VANCOMYCIN 1 SENSITIVE Sensitive     * >=100,000 COLONIES/mL ENTEROCOCCUS SPECIES    Radiology Reports Dg Chest 1 View  01/20/2016  CLINICAL DATA:  Status post  right thoracentesis. EXAM: CHEST 1 VIEW COMPARISON:  01/19/2016 FINDINGS: The cardiac silhouette is enlarged. Mediastinal contours appear intact. The aorta is torturous and contains atherosclerotic calcifications There is no evidence of pneumothorax. There is decreased right pleural effusion. There is stable left pleural effusion. Findings of pulmonary edema are stable. There is left basilar atelectasis. Osseous structures are without acute abnormality. Kyphoplasty of the upper thoracic spine is noted. Soft tissues are grossly normal. IMPRESSION: No evidence of pneumothorax status post right thoracentesis, with decrease in the size of right pleural effusion. Persistent moderate in size left pleural effusion with left lung base atelectasis versus airspace consolidation. Electronically Signed   By: Fidela Salisbury M.D.   On: 01/20/2016 12:27   Dg Chest 2 View  01/19/2016  CLINICAL DATA:  Congestive heart failure.  Weakness. EXAM: CHEST  2 VIEW COMPARISON:  01/10/2016 FINDINGS: Large layering bilateral pleural effusions with passive atelectasis. Fluid tracks in the major fissures. There has been improvement in the prior pulmonary edema. Although the lung bases are obscured in the mid lungs are hazy due to the layering effusions, the vasculature in the upper zones is clearly more sharply defined and with less interstitial accentuation. Heart borders are somewhat obscured. Upper thoracic vertebral augmentations in the past. Degenerative glenohumeral arthropathy bilaterally. IMPRESSION: 1. Large bilateral pleural effusions with passive atelectasis. Resolving pulmonary edema, significantly improved from prior. Electronically Signed   By: Van Clines M.D.   On: 01/19/2016 14:01   Dg Chest Port 1 View  01/10/2016  CLINICAL DATA:  Shortness of breath. EXAM: PORTABLE CHEST 1 VIEW COMPARISON:  Radiographs 01/05/2016, CT 01/06/2016 FINDINGS: Heart at the upper limits normal in size. Perihilar pulmonary edema has  progressed from prior exam. Bilateral pleural effusions, likely increased on the right in the interim. No pneumothorax. Kyphoplasty noted in the upper thoracic spine. IMPRESSION: Progressive pulmonary edema and pleural effusions consistent with CHF. Electronically Signed   By: Fonnie Birkenhead.D.  On: 01/10/2016 03:59   US Thoracentesis Asp Pleural Space W/img Guide  01/20/2016  INDICATION: Patient with history of CHF, dyspnea, bilateral pleural effusions, atrial fibrillation. Request is made for diagnostic and therapeutic right thoracentesis. EXAM: ULTRASOUND GUIDED DIAGNOSTIC AND THERAPEUTIC RIGHT THORACENTESIS MEDICATIONS: None. COMPLICATIONS: None immediate. PROCEDURE: An ultrasound guided thoracentesis was thoroughly discussed with the patient and questions answered. The benefits, risks, alternatives and complications were also discussed. The patient understands and wishes to proceed with the procedure. Written consent was obtained. Ultrasound was performed to localize and mark an adequate pocket of fluid in the right chest. The area was then prepped and draped in the normal sterile fashion. 1% Lidocaine was used for local anesthesia. Under ultrasound guidance a Safe-T-Centesis catheter was introduced. Thoracentesis was performed. The catheter was removed and a dressing applied. FINDINGS: A total of approximately 1.2 liters of yellow fluid was removed. Samples were sent to the laboratory as requested by the clinical team. IMPRESSION: Successful ultrasound guided diagnostic and therapeutic right thoracentesis yielding 1.2 liters of pleural fluid. Read by: Rowe Robert, PA-C Electronically Signed   By: Sandi Mariscal M.D.   On: 01/20/2016 11:58    CBC  Recent Labs Lab 01/18/16 0805 01/19/16 0613 01/20/16 0420 01/21/16 0337 01/22/16 0340  WBC 13.2* 15.6* 17.9* 17.2* 14.9*  HGB 14.1 14.4 13.6 13.2 12.4  HCT 41.8 43.4 42.7 41.0 39.4  PLT 356 361 406* 389 364  MCV 92.3 92.9 94.5 93.2 93.8  MCH  31.1 30.8 30.1 30.0 29.5  MCHC 33.7 33.2 31.9 32.2 31.5  RDW 14.3 14.4 14.6 14.6 14.5    Chemistries   Recent Labs Lab 01/17/16 0446 01/19/16 0613 01/20/16 0420 01/21/16 0337 01/22/16 0340  NA 124* 135 136 132* 136  K 3.6 2.6* 4.0 3.5 3.7  CL 84* 89* 89* 91* 95*  CO2 25 36* 37* 32 32  GLUCOSE 101* 89 57* 76 88  BUN 23* 18 21* 19 16  CREATININE 0.76 0.70 0.76 0.70 0.73  CALCIUM 8.3* 8.2* 8.4* 8.1* 8.2*  MG  --  2.1  --   --   --    ------------------------------------------------------------------------------------------------------------------ estimated creatinine clearance is 42.7 mL/min (by C-G formula based on Cr of 0.73). ------------------------------------------------------------------------------------------------------------------ No results for input(s): HGBA1C in the last 72 hours. ------------------------------------------------------------------------------------------------------------------ No results for input(s): CHOL, HDL, LDLCALC, TRIG, CHOLHDL, LDLDIRECT in the last 72 hours. ------------------------------------------------------------------------------------------------------------------ No results for input(s): TSH, T4TOTAL, T3FREE, THYROIDAB in the last 72 hours.  Invalid input(s): FREET3 ------------------------------------------------------------------------------------------------------------------ No results for input(s): VITAMINB12, FOLATE, FERRITIN, TIBC, IRON, RETICCTPCT in the last 72 hours.  Coagulation profile No results for input(s): INR, PROTIME in the last 168 hours.  No results for input(s): DDIMER in the last 72 hours.  Cardiac Enzymes No results for input(s): CKMB, TROPONINI, MYOGLOBIN in the last 168 hours.  Invalid input(s): CK ------------------------------------------------------------------------------------------------------------------ Invalid input(s): POCBNP    Jesson Foskey D.O. on 01/22/2016 at 11:41 AM  Between  7am to 7pm - Pager - [phone]  After 7pm go to [email or web address] - password [email or web address]  And look for the night coverage person covering for me after hours  [facility] Office  [phone]

## 2016-01-23 DIAGNOSIS — N390 Urinary tract infection, site not specified: Secondary | ICD-10-CM

## 2016-01-23 LAB — BASIC METABOLIC PANEL
ANION GAP: 9 (ref 5–15)
BUN: 14 mg/dL (ref 6–20)
CHLORIDE: 97 mmol/L — AB (ref 101–111)
CO2: 28 mmol/L (ref 22–32)
Calcium: 8.2 mg/dL — ABNORMAL LOW (ref 8.9–10.3)
Creatinine, Ser: 0.76 mg/dL (ref 0.44–1.00)
Glucose, Bld: 86 mg/dL (ref 65–99)
POTASSIUM: 3.1 mmol/L — AB (ref 3.5–5.1)
SODIUM: 134 mmol/L — AB (ref 135–145)

## 2016-01-23 LAB — CBC
HCT: 38.9 % (ref 36.0–46.0)
HEMOGLOBIN: 12.9 g/dL (ref 12.0–15.0)
MCH: 31.2 pg (ref 26.0–34.0)
MCHC: 33.2 g/dL (ref 30.0–36.0)
MCV: 94.2 fL (ref 78.0–100.0)
PLATELETS: 344 10*3/uL (ref 150–400)
RBC: 4.13 MIL/uL (ref 3.87–5.11)
RDW: 14.6 % (ref 11.5–15.5)
WBC: 13 10*3/uL — AB (ref 4.0–10.5)

## 2016-01-23 MED ORDER — LEVOFLOXACIN 500 MG PO TABS
250.0000 mg | ORAL_TABLET | Freq: Every day | ORAL | Status: DC
  Administered 2016-01-23: 250 mg via ORAL
  Filled 2016-01-23: qty 1

## 2016-01-23 MED ORDER — FUROSEMIDE 40 MG PO TABS
40.0000 mg | ORAL_TABLET | Freq: Two times a day (BID) | ORAL | Status: AC
Start: 2016-01-23 — End: ?

## 2016-01-23 MED ORDER — POTASSIUM CHLORIDE CRYS ER 20 MEQ PO TBCR
40.0000 meq | EXTENDED_RELEASE_TABLET | Freq: Once | ORAL | Status: AC
  Administered 2016-01-23: 40 meq via ORAL
  Filled 2016-01-23: qty 2

## 2016-01-23 MED ORDER — HYDROCODONE-ACETAMINOPHEN 7.5-325 MG PO TABS
1.0000 | ORAL_TABLET | Freq: Four times a day (QID) | ORAL | Status: AC | PRN
Start: 2016-01-23 — End: ?

## 2016-01-23 MED ORDER — PHENAZOPYRIDINE HCL 100 MG PO TABS: 100.0000 mg | ORAL_TABLET | Freq: Three times a day (TID) | ORAL | Status: AC

## 2016-01-23 MED ORDER — LEVOFLOXACIN 250 MG PO TABS: 250.0000 mg | ORAL_TABLET | Freq: Every day | ORAL | Status: AC

## 2016-01-23 MED ORDER — AMIODARONE HCL 400 MG PO TABS: 400.0000 mg | ORAL_TABLET | Freq: Two times a day (BID) | ORAL | Status: AC

## 2016-01-23 MED ORDER — BENZONATATE 100 MG PO CAPS: 100.0000 mg | ORAL_CAPSULE | Freq: Three times a day (TID) | ORAL | Status: DC | PRN

## 2016-01-23 MED ORDER — APIXABAN 2.5 MG PO TABS: 2.5000 mg | ORAL_TABLET | Freq: Two times a day (BID) | ORAL | Status: DC

## 2016-01-23 MED ORDER — PRAMOXINE-ZINC OXIDE IN MO 1-12.5 % RE OINT: TOPICAL_OINTMENT | Freq: Three times a day (TID) | RECTAL | Status: AC | PRN

## 2016-01-23 MED ORDER — LEVOFLOXACIN 500 MG PO TABS: 250.0000 mg | ORAL_TABLET | Freq: Every day | ORAL | Status: DC

## 2016-01-23 MED ORDER — POTASSIUM CHLORIDE CRYS ER 20 MEQ PO TBCR: 40.0000 meq | EXTENDED_RELEASE_TABLET | Freq: Every day | ORAL | Status: AC

## 2016-01-23 MED ORDER — PHENAZOPYRIDINE HCL 100 MG PO TABS
100.0000 mg | ORAL_TABLET | Freq: Three times a day (TID) | ORAL | Status: DC
  Administered 2016-01-23 (×3): 100 mg via ORAL
  Filled 2016-01-23 (×5): qty 1

## 2016-01-23 MED ORDER — AMIODARONE HCL 200 MG PO TABS: 400.0000 mg | ORAL_TABLET | Freq: Every day | ORAL | Status: DC

## 2016-01-23 MED ORDER — METOPROLOL TARTRATE 25 MG PO TABS: 25.0000 mg | ORAL_TABLET | Freq: Two times a day (BID) | ORAL | Status: AC

## 2016-01-23 MED ORDER — AMIODARONE HCL 400 MG PO TABS: 400.0000 mg | ORAL_TABLET | Freq: Every day | ORAL | Status: AC

## 2016-01-23 NOTE — Care Management Important Message (Signed)
Important Message  Patient Details  Name: [name] [name] MRN: [id number] Date of Birth: October 16, 1932   Medicare Important Message Given:  Yes    Maryclare Labrador, RN 01/23/2016, 9:52 AM

## 2016-01-23 NOTE — Progress Notes (Signed)
Pt is stable. Pt d/c to [facility] [place]. Pt's daughter is notified of the d/c.

## 2016-01-23 NOTE — Care Management Note (Addendum)
Case Management Note  Patient Details  Name: [name] [name] MRN: [id number] Date of Birth: [birth year]  Subjective/Objective:    Pt admitted with  A fib  Action/Plan:  Pt will discharge to SNF, CSW actively working placement today   Expected Discharge Date:                  Expected Discharge Plan:  [place]  In-House Referral:  Clinical Social Work  Discharge planning Services  CM Consult, Medication Assistance  Post Acute Care Choice:    Choice offered to:     DME Arranged:    DME Agency:     HH Arranged:    [place] Agency:     Status of Service:  Completed, signed off  Medicare Important Message Given:  Yes Date Medicare IM Given:    Medicare IM give by:    Date Additional Medicare IM Given:    Additional Medicare Important Message give by:     If discussed at [place] of Stay Meetings, dates discussed:    Additional Comments:  Maryclare Labrador, RN 01/23/2016, 9:52 AM Pt will discharge to [facility] SNF today

## 2016-01-23 NOTE — Progress Notes (Signed)
Patient will discharge to [facility] SNF Anticipated discharge date:2/13 Family notified: pt dtr Transportation by [facility]- scheduled for 1pm  CSW signing off.  Domenica Reamer, [place] Social Worker [phone]

## 2016-01-23 NOTE — Clinical Social Work Placement (Signed)
   CLINICAL SOCIAL WORK PLACEMENT  NOTE  Date:  01/23/2016  Patient Details  Name: [name] [name] MRN: [id number] Date of Birth: 1932/03/05  Clinical Social Work is seeking post-discharge placement for this patient at the [place] level of care (*CSW will initial, date and re-position this form in  chart as items are completed):  Yes   Patient/family provided with [place] Work Department's list of facilities offering this level of care within the geographic area requested by the patient (or if unable, by the patient's family).  Yes   Patient/family informed of their freedom to choose among providers that offer the needed level of care, that participate in Medicare, Medicaid or managed care program needed by the patient, have an available bed and are willing to accept the patient.  Yes   Patient/family informed of [place]'s ownership interest in [facility] and [facility], as well as of the fact that they are under no obligation to receive care at these facilities.  PASRR submitted to EDS on       PASRR number received on       Existing PASRR number confirmed on 01/17/16     FL2 transmitted to all facilities in geographic area requested by pt/family on 01/17/16     FL2 transmitted to all facilities within larger geographic area on       Patient informed that his/her managed care company has contracts with or will negotiate with certain facilities, including the following:            Patient/family informed of bed offers received.  Patient chooses bed at [facility]     Physician recommends and patient chooses bed at      Patient to be transferred to [facility] on 01/23/16.  Patient to be transferred to facility by [facility]     Patient family notified on 01/23/16 of transfer.  Name of family member notified:  Garry-Ann     PHYSICIAN Please sign FL2     Additional Comment:     _______________________________________________ Cranford Mon, LCSW 01/23/2016, 11:51 AM

## 2016-01-23 NOTE — Discharge Summary (Signed)
Physician Discharge Summary  [name] [name] [id number] DOB: 1932-11-12 DOA: 01/06/2016  PCP: Glenda Chroman., MD  Admit date: 01/06/2016 Discharge date: 01/23/2016  Time spent: 45 minutes  Recommendations for Outpatient Follow-up:  Patient will be discharged to [facility] skilled nursing facility.  Continue therapy as recommended by the facility.  Patient will need to follow up with primary care provider within one week of discharge. Repeat CBC and BMP in one week.  Patient should continue medications as prescribed.  Patient should follow a heart healthy diet.   Discharge Diagnoses:  New atrial fibrillation with RVR Acute renal injury Acute combined systolic and diastolic heart failure Bilateral pleural effusions Left bundle branch block Hypertension/CAD Hyponatremia Anxiety Urinary tract infection with leukocytosis Constipation/diarrhea Hemorrhoidal pain Generalized weakness  Discharge Condition: Stable  Diet recommendation: heart healthy  Filed Weights   01/21/16 0434 01/22/16 0406 01/23/16 0725  Weight: 56.8 kg (125 lb 3.5 oz) 56.6 kg (124 lb 12.5 oz) 54.432 kg (120 lb)    History of present illness:  on 01/06/16 by Dr. Garnette Gunner is a 82 y.o. female with a past medical history significant for HTN and chronic systolic and diastolic CHF, but last EF 55% who presents with SOB and afib.  The patient was in her usual state of health until the beginning of this month when she developed dyspnea and cough, was observed overnight at [facility], treated for pneumonia and discharged. A week later, she developed chest discomfort and weakness, checked her HR on her BP cuff, found it to be elevated, went back to the hospital and was diagnosed with new onset Afib. She reports being "on a drip in the ICU" for five days, then converted to an oral agent (she thinks for HR control), seen by a Cardiologist who discussed cardioversion with her, and  then being discharged to a rehab facility about a week ago.  Now, over the last week, she has had progressive weakness and shortness of breath when working with PT at rehabilitation. At night she also describes spells of vague chest discomfort that are intermittent and resolve with lorazepam. She has not had leg swelling, orthopnea, paroxysmal nocturnal dyspnea, frank chest pain, palpitations, or fever, chills, cough, dyspnea at rest, rigors, or sputum.  She was admitted to [facility] last night after a CT angiogram and chest x-ray suggested pulmonary edema without PE, and she was thought to have acute diastolic CHF. She ruled out with serial troponins overnight. Today, the patient's family requested transfer to [facility] because they were hoping to see new doctors. On arrival, the patient was comfortable but in atrial fibrillation with rate 110-150 bpm.   Hospital Course:  New Afib with RVR and aberrant conduction -CHADs2Vasc 5 (CHF, HTN, Age, gender).  -Cardiology consulted and appreciated -DCCV 01/11/16, however was back in Afib -Currently in NSR -Continue amiodarone, Eliquis -Follow up with cardiology  Acute renal failure -Resolved -Continue to monitor BMP  Acute combined systolic and diastolic CHF  -Old history of ischemic CM -Echocardiogram 01/09/16: EF 35-40% -Continue Lasix- transitioned to PO 40mg  BID  -Monitor intake/output, daily weights  B/L Pleural effusions -CXR 01/19/16: Large Bilateral pleural effusions with passive atelectasis, resolving pulmonary edema -IR consulted for ultrasound-guided thoracentesis -1.2 L fluid removed from the right, sent for labs- no organisms seen  LBBB -No previous records, but this appears to be chronic.  HTN and CAD -Stable  Hyponatremia -Resolved, In setting of Afib and resulting CHF  Anxiety -Continue lorazepam PRN  Urinary tract  infection/ Leukocytosis -Currently afebrile -Blood cultures show no growth to date -UA: + UTI   -Urine culture >100K enterococcus  -Given PCN allergy and no history of using cephalosporin, placed on ciprofloxacin -leukocytosis improving   -Will continue levaquin for additional 3 days (250mg  daily) and pyridium    Constipation/Diarrhea -patient was given dulcolax supp on 2/8 as patient stated she did not have a BM in over 1 week -Discontinued miralax, colace -Now patient states she is having diarrhea which has slowed down  Hemorrhoidal pain -Continue TUCKS  Generalized weakness -PT OT recommended SNF   Code Status: DNR  Procedures  Cardioversion Echocardiogram Right US guided thoracentesis  Consults  Cardiology Interventional radiology  Discharge Exam: Filed Vitals:   01/23/16 0725 01/23/16 0910  BP: 124/61 117/51  Pulse: 61 62  Temp: 98 F (36.7 C) 97.5 F (36.4 C)  Resp: 17 14   Exam  General: Well developed, elderly, thin, NAD  HEENT: NCAT, mucous membranes moist.   Cardiovascular: S1 S2 auscultated, no murmurs, RRR  Respiratory: Diminished but clear  Abdomen: Soft, nontender, nondistended, + bowel sounds  Extremities: warm dry without cyanosis clubbing or edema  Neuro: AAOx3, nonfocal  Psych: Flat affect  Discharge Instructions      Discharge Instructions    Discharge instructions    Complete by:  As directed   Patient will be discharged to [facility] skilled nursing facility.  Continue therapy as recommended by the facility.  Patient will need to follow up with primary care provider within one week of discharge. Repeat CBC and BMP in one week.  Patient should continue medications as prescribed.  Patient should follow a heart healthy diet.            Medication List    STOP taking these medications        bisoprolol-hydrochlorothiazide 2.5-6.25 MG tablet  Commonly known as:  ZIAC     lisinopril 10 MG tablet  Commonly known as:  PRINIVIL,ZESTRIL     polyethylene glycol packet  Commonly known as:  MIRALAX / GLYCOLAX        TAKE these medications        amiodarone 400 MG tablet  Commonly known as:  PACERONE  Take 1 tablet (400 mg total) by mouth 2 (two) times daily. Stop on 01/30/2016     amiodarone 400 MG tablet  Commonly known as:  PACERONE  Take 1 tablet (400 mg total) by mouth daily. Start on 01/31/2016  Start taking on:  01/31/2016     apixaban 2.5 MG Tabs tablet  Commonly known as:  ELIQUIS  Take 1 tablet (2.5 mg total) by mouth every 12 (twelve) hours.     benzonatate 100 MG capsule  Commonly known as:  TESSALON  Take 1 capsule (100 mg total) by mouth 3 (three) times daily as needed for cough.     calcium-vitamin D 500-200 MG-UNIT tablet  Commonly known as:  OSCAL WITH D  Take 1 tablet by mouth daily.     furosemide 40 MG tablet  Commonly known as:  LASIX  Take 1 tablet (40 mg total) by mouth 2 (two) times daily.     HYDROcodone-acetaminophen 7.5-325 MG tablet  Commonly known as:  NORCO  Take 1 tablet by mouth every 6 (six) hours as needed for moderate pain.     levofloxacin 250 MG tablet  Commonly known as:  LEVAQUIN  Take 1 tablet (250 mg total) by mouth daily.     LORazepam 1 MG tablet  Commonly known as:  ATIVAN  Take 0.5-1 mg by mouth every 8 (eight) hours as needed for anxiety.     metoprolol tartrate 25 MG tablet  Commonly known as:  LOPRESSOR  Take 1 tablet (25 mg total) by mouth 2 (two) times daily.     omeprazole 20 MG capsule  Commonly known as:  PRILOSEC  Take 20 mg by mouth daily.     phenazopyridine 100 MG tablet  Commonly known as:  PYRIDIUM  Take 1 tablet (100 mg total) by mouth 3 (three) times daily with meals.     potassium chloride SA 20 MEQ tablet  Commonly known as:  K-DUR,KLOR-CON  Take 2 tablets (40 mEq total) by mouth daily.     pramoxine-mineral oil-zinc 1-12.5 % rectal ointment  Commonly known as:  TUCKS  Place rectally 3 (three) times daily as needed for itching.     VITAMIN D-3 PO  Take 2,000 mg by mouth daily.     vitamin E 400 UNIT capsule   Generic drug:  vitamin E  Take 400 Units by mouth daily.       Allergies  Allergen Reactions  . Penicillins Other (See Comments)   Follow-up Information    Follow up with VYAS,DHRUV B., MD. Schedule an appointment as soon as possible for a visit in 1 week.   Specialty:  Internal Medicine   Why:  hospital follow up   Contact information:   [address] [place] [address] [phone]        The results of significant diagnostics from this hospitalization (including imaging, microbiology, ancillary and laboratory) are listed below for reference.    Significant Diagnostic Studies: Dg Chest 1 View  01/20/2016  CLINICAL DATA:  Status post right thoracentesis. EXAM: CHEST 1 VIEW COMPARISON:  01/19/2016 FINDINGS: The cardiac silhouette is enlarged. Mediastinal contours appear intact. The aorta is torturous and contains atherosclerotic calcifications There is no evidence of pneumothorax. There is decreased right pleural effusion. There is stable left pleural effusion. Findings of pulmonary edema are stable. There is left basilar atelectasis. Osseous structures are without acute abnormality. Kyphoplasty of the upper thoracic spine is noted. Soft tissues are grossly normal. IMPRESSION: No evidence of pneumothorax status post right thoracentesis, with decrease in the size of right pleural effusion. Persistent moderate in size left pleural effusion with left lung base atelectasis versus airspace consolidation. Electronically Signed   By: Fidela Salisbury M.D.   On: 01/20/2016 12:27   Dg Chest 2 View  01/19/2016  CLINICAL DATA:  Congestive heart failure.  Weakness. EXAM: CHEST  2 VIEW COMPARISON:  01/10/2016 FINDINGS: Large layering bilateral pleural effusions with passive atelectasis. Fluid tracks in the major fissures. There has been improvement in the prior pulmonary edema. Although the lung bases are obscured in the mid lungs are hazy due to the layering effusions, the vasculature in the upper  zones is clearly more sharply defined and with less interstitial accentuation. Heart borders are somewhat obscured. Upper thoracic vertebral augmentations in the past. Degenerative glenohumeral arthropathy bilaterally. IMPRESSION: 1. Large bilateral pleural effusions with passive atelectasis. Resolving pulmonary edema, significantly improved from prior. Electronically Signed   By: Van Clines M.D.   On: 01/19/2016 14:01   Dg Chest Port 1 View  01/10/2016  CLINICAL DATA:  Shortness of breath. EXAM: PORTABLE CHEST 1 VIEW COMPARISON:  Radiographs 01/05/2016, CT 01/06/2016 FINDINGS: Heart at the upper limits normal in size. Perihilar pulmonary edema has progressed from prior exam. Bilateral pleural effusions, likely increased  on the right in the interim. No pneumothorax. Kyphoplasty noted in the upper thoracic spine. IMPRESSION: Progressive pulmonary edema and pleural effusions consistent with CHF. Electronically Signed   By: Jeb Levering M.D.   On: 01/10/2016 03:59   US Thoracentesis Asp Pleural Space W/img Guide  01/20/2016  INDICATION: Patient with history of CHF, dyspnea, bilateral pleural effusions, atrial fibrillation. Request is made for diagnostic and therapeutic right thoracentesis. EXAM: ULTRASOUND GUIDED DIAGNOSTIC AND THERAPEUTIC RIGHT THORACENTESIS MEDICATIONS: None. COMPLICATIONS: None immediate. PROCEDURE: An ultrasound guided thoracentesis was thoroughly discussed with the patient and questions answered. The benefits, risks, alternatives and complications were also discussed. The patient understands and wishes to proceed with the procedure. Written consent was obtained. Ultrasound was performed to localize and mark an adequate pocket of fluid in the right chest. The area was then prepped and draped in the normal sterile fashion. 1% Lidocaine was used for local anesthesia. Under ultrasound guidance a Safe-T-Centesis catheter was introduced. Thoracentesis was performed. The catheter was  removed and a dressing applied. FINDINGS: A total of approximately 1.2 liters of yellow fluid was removed. Samples were sent to the laboratory as requested by the clinical team. IMPRESSION: Successful ultrasound guided diagnostic and therapeutic right thoracentesis yielding 1.2 liters of pleural fluid. Read by: Rowe Robert, PA-C Electronically Signed   By: Sandi Mariscal M.D.   On: 01/20/2016 11:58    Microbiology: Recent Results (from the past 240 hour(s))  Culture, blood (routine x 2)     Status: None (Preliminary result)   Collection Time: 01/20/16  7:58 AM  Result Value Ref Range Status   Specimen Description BLOOD LEFT ANTECUBITAL  Final   Special Requests IN PEDIATRIC BOTTLE 3CC  Final   Culture NO GROWTH 2 DAYS  Final   Report Status PENDING  Incomplete  Culture, blood (routine x 2)     Status: None (Preliminary result)   Collection Time: 01/20/16  8:02 AM  Result Value Ref Range Status   Specimen Description BLOOD RIGHT ANTECUBITAL  Final   Special Requests BOTTLES DRAWN AEROBIC ONLY 5CC  Final   Culture NO GROWTH 2 DAYS  Final   Report Status PENDING  Incomplete  Culture, body fluid-bottle     Status: None (Preliminary result)   Collection Time: 01/20/16 11:36 AM  Result Value Ref Range Status   Specimen Description FLUID RIGHT PLEURAL  Final   Special Requests BOTTLES DRAWN AEROBIC AND ANAEROBIC 10MLS  Final   Culture NO GROWTH 2 DAYS  Final   Report Status PENDING  Incomplete  Gram stain     Status: None   Collection Time: 01/20/16 11:36 AM  Result Value Ref Range Status   Specimen Description FLUID RIGHT PLEURAL  Final   Special Requests NONE  Final   Gram Stain   Final    ABUNDANT WBC PRESENT,BOTH PMN AND MONONUCLEAR NO ORGANISMS SEEN    Report Status 01/20/2016 FINAL  Final  Culture, Urine     Status: None   Collection Time: 01/20/16  1:17 PM  Result Value Ref Range Status   Specimen Description URINE, CLEAN CATCH  Final   Special Requests NONE  Final   Culture  >=100,000 COLONIES/mL ENTEROCOCCUS SPECIES  Final   Report Status 01/22/2016 FINAL  Final   Organism ID, Bacteria ENTEROCOCCUS SPECIES  Final      Susceptibility   Enterococcus species - MIC*    AMPICILLIN <=2 SENSITIVE Sensitive     LEVOFLOXACIN 1 SENSITIVE Sensitive     NITROFURANTOIN <=16  SENSITIVE Sensitive     VANCOMYCIN 1 SENSITIVE Sensitive     * >=100,000 COLONIES/mL ENTEROCOCCUS SPECIES     Labs: Basic Metabolic Panel:  Recent Labs Lab 01/19/16 0613 01/20/16 0420 01/21/16 0337 01/22/16 0340 01/23/16 0351  NA 135 136 132* 136 134*  K 2.6* 4.0 3.5 3.7 3.1*  CL 89* 89* 91* 95* 97*  CO2 36* 37* 32 32 28  GLUCOSE 89 57* 76 88 86  BUN 18 21* 19 16 14   CREATININE 0.70 0.76 0.70 0.73 0.76  CALCIUM 8.2* 8.4* 8.1* 8.2* 8.2*  MG 2.1  --   --   --   --    Liver Function Tests: No results for input(s): AST, ALT, ALKPHOS, BILITOT, PROT, ALBUMIN in the last 168 hours. No results for input(s): LIPASE, AMYLASE in the last 168 hours. No results for input(s): AMMONIA in the last 168 hours. CBC:  Recent Labs Lab 01/19/16 0613 01/20/16 0420 01/21/16 0337 01/22/16 0340 01/23/16 0351  WBC 15.6* 17.9* 17.2* 14.9* 13.0*  HGB 14.4 13.6 13.2 12.4 12.9  HCT 43.4 42.7 41.0 39.4 38.9  MCV 92.9 94.5 93.2 93.8 94.2  PLT 361 406* 389 364 344   Cardiac Enzymes: No results for input(s): CKTOTAL, CKMB, CKMBINDEX, TROPONINI in the last 168 hours. BNP: BNP (last 3 results)  Recent Labs  01/09/16 0447  BNP 94.1    ProBNP (last 3 results) No results for input(s): PROBNP in the last 8760 hours.  CBG: No results for input(s): GLUCAP in the last 168 hours.     SignedCristal Ford  [facility] 01/23/2016, 11:08 AM

## 2016-01-24 NOTE — Anesthesia Postprocedure Evaluation (Signed)
Anesthesia Post Note  Patient: [name] [name]  Procedure(s) Performed: Procedure(s) (LRB): TRANSESOPHAGEAL ECHOCARDIOGRAM (TEE) (N/A) CARDIOVERSION (N/A)  Patient location during evaluation: PACU Anesthesia Type: MAC Level of consciousness: awake and alert Pain management: pain level controlled Vital Signs Assessment: post-procedure vital signs reviewed and stable Respiratory status: spontaneous breathing Cardiovascular status: stable Anesthetic complications: no    Last Vitals:  Filed Vitals:   01/23/16 0725 01/23/16 0910  BP: 124/61 117/51  Pulse: 61 62  Temp: 36.7 C 36.4 C  Resp: 17 14    Last Pain:  Filed Vitals:   01/23/16 0916  PainSc: [place]

## 2016-01-25 LAB — CULTURE, BODY FLUID W GRAM STAIN -BOTTLE

## 2016-01-25 LAB — CULTURE, BLOOD (ROUTINE X 2)
Culture: NO GROWTH
Culture: NO GROWTH

## 2016-01-25 LAB — CULTURE, BODY FLUID-BOTTLE: CULTURE: NO GROWTH

## 2016-02-07 ENCOUNTER — Encounter: Payer: Medicare Other | Admitting: Cardiovascular Disease

## 2016-02-08 ENCOUNTER — Encounter: Payer: Self-pay | Admitting: Cardiovascular Disease

## 2016-03-28 ENCOUNTER — Encounter (INDEPENDENT_AMBULATORY_CARE_PROVIDER_SITE_OTHER): Payer: Self-pay | Admitting: Internal Medicine

## 2016-03-28 ENCOUNTER — Ambulatory Visit (INDEPENDENT_AMBULATORY_CARE_PROVIDER_SITE_OTHER): Payer: Medicare Other | Admitting: Internal Medicine

## 2016-03-28 VITALS — BP 100/60 | HR 84 | Temp 97.0°F | Ht 60.0 in | Wt 115.9 lb

## 2016-03-28 DIAGNOSIS — K5641 Fecal impaction: Secondary | ICD-10-CM | POA: Diagnosis not present

## 2016-03-28 NOTE — Progress Notes (Addendum)
   Subjective:    Patient ID: [name] [name], female    DOB: [birth year], 82 y.o.   MRN: [id number]  HPI She underwent a colonoscopy by Dr. Anthony Sar for chronic diarrhea and rectal pain 02/13/2016. Abnormal mucosa was found in the rectum, sigmoid colon, and descending colon. The mucosa was friable, edematous and erosions. More than 75% of the surface was affected.  This was likely consistent with infectious colitis, possible pseudomembranous colitis.  Multiple biopsies  Sent. Biopsy: Negative for C-difficile. Per patient, she was covered with ? Antibiotics.  She tells me when she has diarrhea, she has severe menstrual cramps and achy.  She says a small amt of stool or flatus is expelled.  She has not had a BM in the last couple of days. Daughter says she has not had a BM for several months. Her stools are very loose but not water. She is having about 2 stools a day. Her appetite is terrible. She has had weight loss. She has lost about 11 pounds since her last visit 2 years ago.   Hx of colon cancer in 1980 at [facility] by Dr. Alfonse Ras.  Has been evaluated by Leighton Ruff MD at [facility] for constipation in 04/2013. New onset of atrial fib and is maintained on Eliquis. She underwent a cardioversion at [facility] in February.  Admitted to [facility] in February. She is doing Rehab at [facility].   06/28/2014 Colonoscopy: High risk colon cancer surveillance. Personal hx of colon cancer. Last colonoscopy 2009. [name] [name]. [name] [name]. Anal stricture found on digital rectal exam. One 18mm polyp in the transverse colond. Resected and retrieved. Patient end to end colo-rectal anastomosis. The distal rectum and anal verge are normal on rectflexion view.   03/19/2001 [name] MD. Anal stenosis. 2. Hemorrhoids A large external hemorrhoid was present in the right anterior position. Anal stenosis was moderate to severe. I injected round and about with 0.50% Marcaine with epinephrine  and massaged it in well. I was able to dilate to 2 fingers but could not insert the adult anoscope. I therefore did a left posterior internal sphincterotomy with careful dilatation. The anoscope was then accepted. A prolapsed mucosal fold was present in the anterior position of the rectal mucosa. I removed the external hemorrhoid in a circumferential fashion so as not to cross the anoderm and risk scarring and more stenosis. I undermined those edges and closed the wound circumferentially as well.   Review of Systems     Objective:   Physical Exam Blood pressure 100/60, pulse 84, temperature 97 F (36.1 C), height 5' (1.524 m), weight 115 lb 14.4 oz (52.572 kg).  Alert and oriented. Skin warm and dry. Oral mucosa is moist.   . Sclera anicteric, conjunctivae is pink. Thyroid not enlarged. No cervical lymphadenopathy. Lungs clear. Heart regular rate and rhythm.  Abdomen is soft. Bowel sounds are positive. No hepatomegaly. No abdominal masses felt. No tenderness.  No edema to lower extremities.  Soft fecal impaction felt. I was unable to manually remove the large impaction.        Assessment & Plan:  Fecal impaction. Patient to return to NH for SS enema till clear. NH will call with enema results.  OV in 6 weeks.

## 2016-03-28 NOTE — Patient Instructions (Addendum)
SS enema till clear. NH will call with results.  OV in 6 weeks.

## 2016-04-06 ENCOUNTER — Telehealth (INDEPENDENT_AMBULATORY_CARE_PROVIDER_SITE_OTHER): Payer: Self-pay | Admitting: Internal Medicine

## 2016-04-06 NOTE — Telephone Encounter (Signed)
Patient's daughter [name] [name] called and stated that the pt, her mom saw [name] a couple weeks ago.  She has a follow up appointment on 05/09/16.  She really would like to to speak to [name].  [phone]

## 2016-04-06 NOTE — Telephone Encounter (Signed)
I have spoken with daughter 

## 2016-05-09 ENCOUNTER — Ambulatory Visit (INDEPENDENT_AMBULATORY_CARE_PROVIDER_SITE_OTHER): Payer: Medicare Other | Admitting: Internal Medicine

## 2016-05-10 DEATH — deceased

## 2016-10-27 IMAGING — CR DG CHEST 1V PORT
1 series · 1 of 1 positions shown · non-contrast
Comparison: Radiographs 01/05/2016, CT 01/06/2016

CLINICAL DATA: Shortness of breath.

EXAM:
PORTABLE CHEST 1 VIEW

[AP]
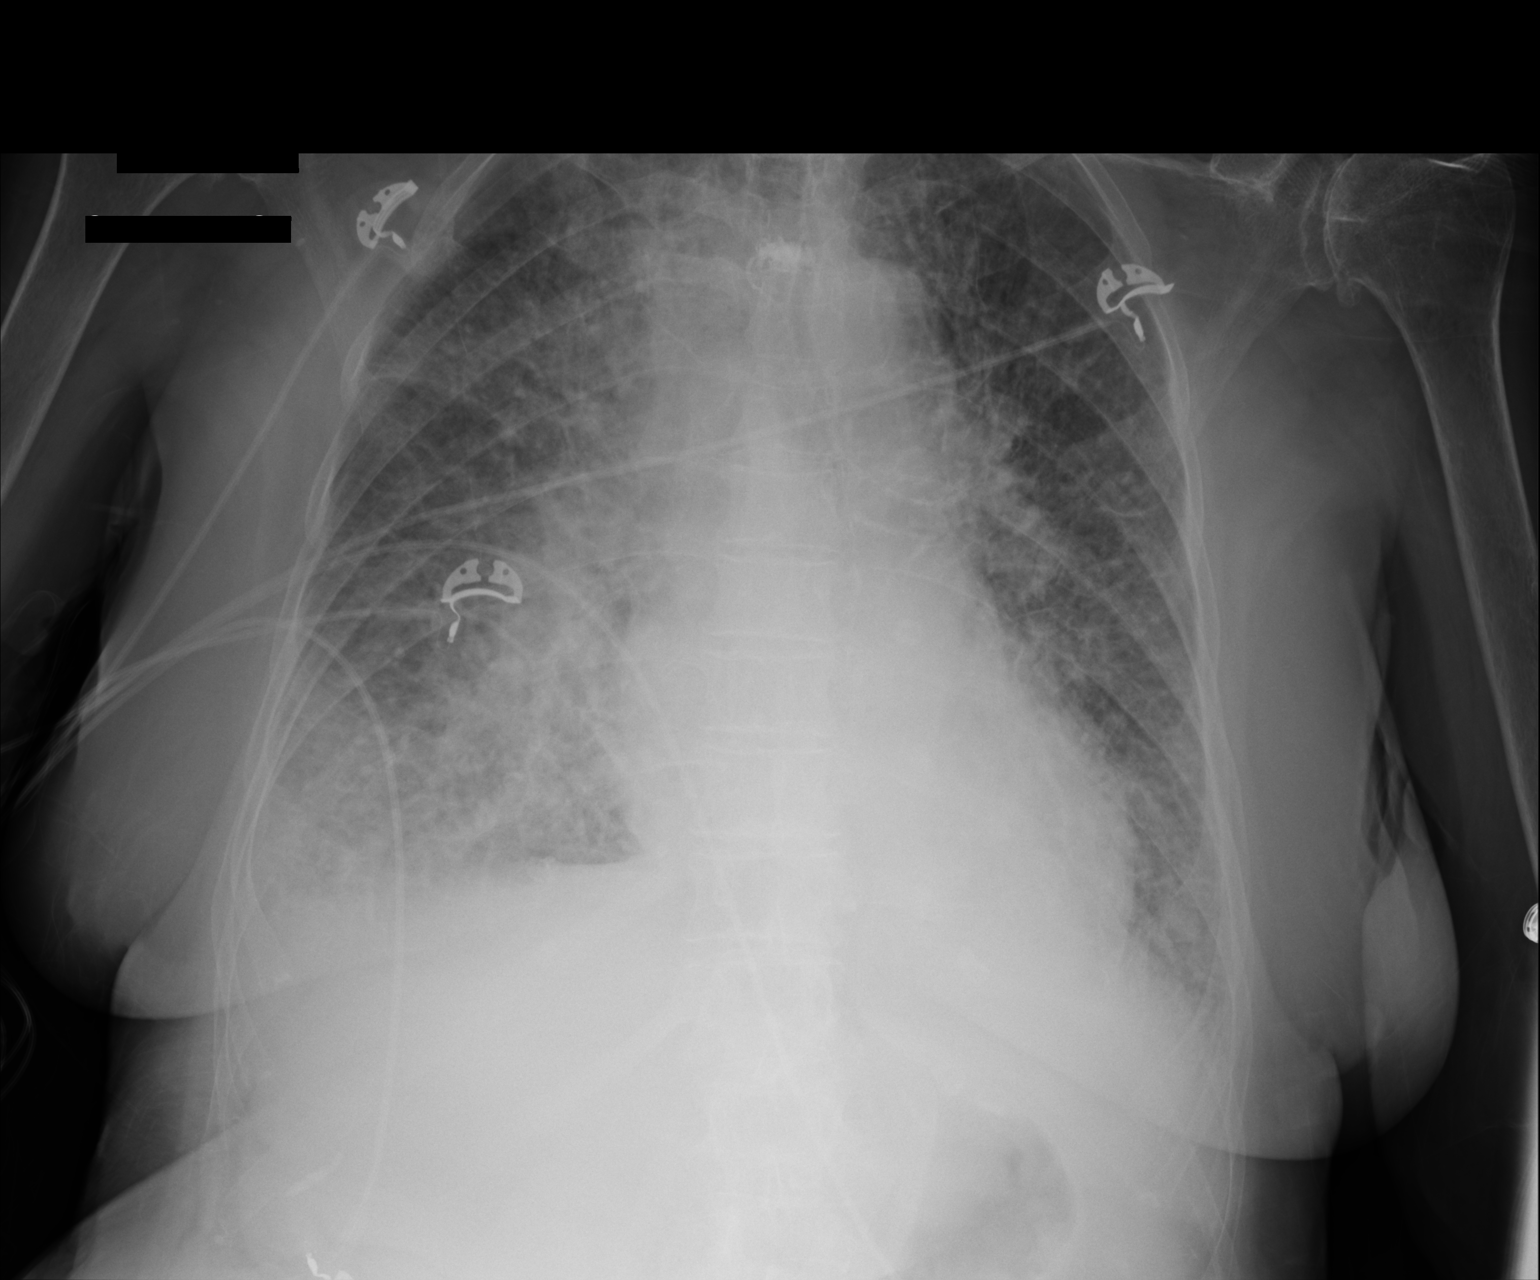

[1 of 1 positions shown; findings below may reference images not displayed]

FINDINGS: Heart at the upper limits normal in size. Perihilar pulmonary edema
has progressed from prior exam. Bilateral pleural effusions, likely
increased on the right in the interim. No pneumothorax. Kyphoplasty
noted in the upper thoracic spine.
IMPRESSION: Progressive pulmonary edema and pleural effusions consistent with
CHF.

## 2016-11-06 IMAGING — US US THORACENTESIS ASP PLEURAL SPACE W/IMG GUIDE
1 series · 7 of 7 positions shown · non-contrast
Comparison: none

INDICATION: Patient with history of CHF, dyspnea, bilateral pleural effusions,
atrial fibrillation. Request is made for diagnostic and therapeutic
right thoracentesis.

[Series 1: us thoracentesis asp pleural space w/img guide · 0.23mm/px · 7 of 7 slices shown]
[im 1/7]
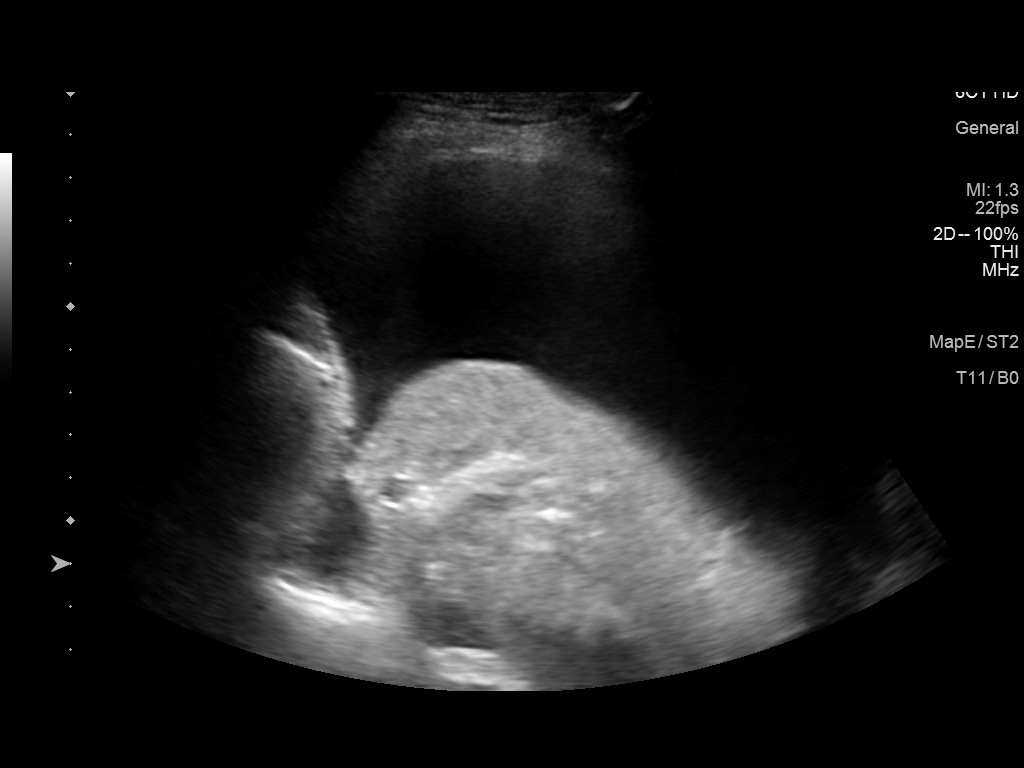
[im 2/7]
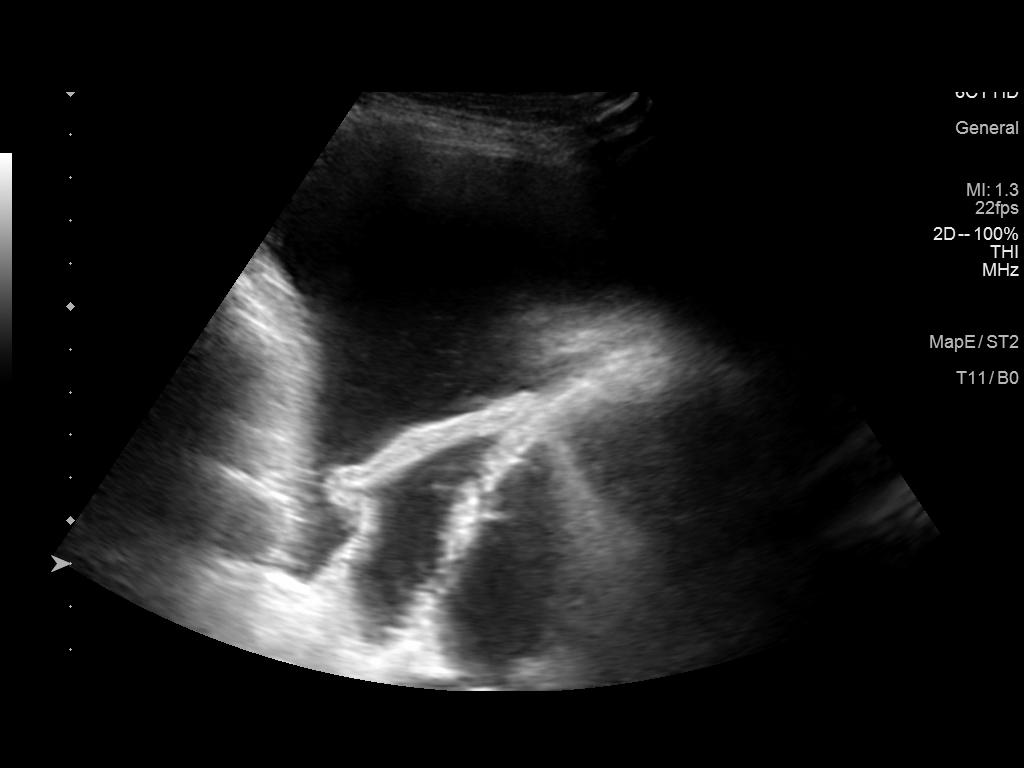
[im 3/7]
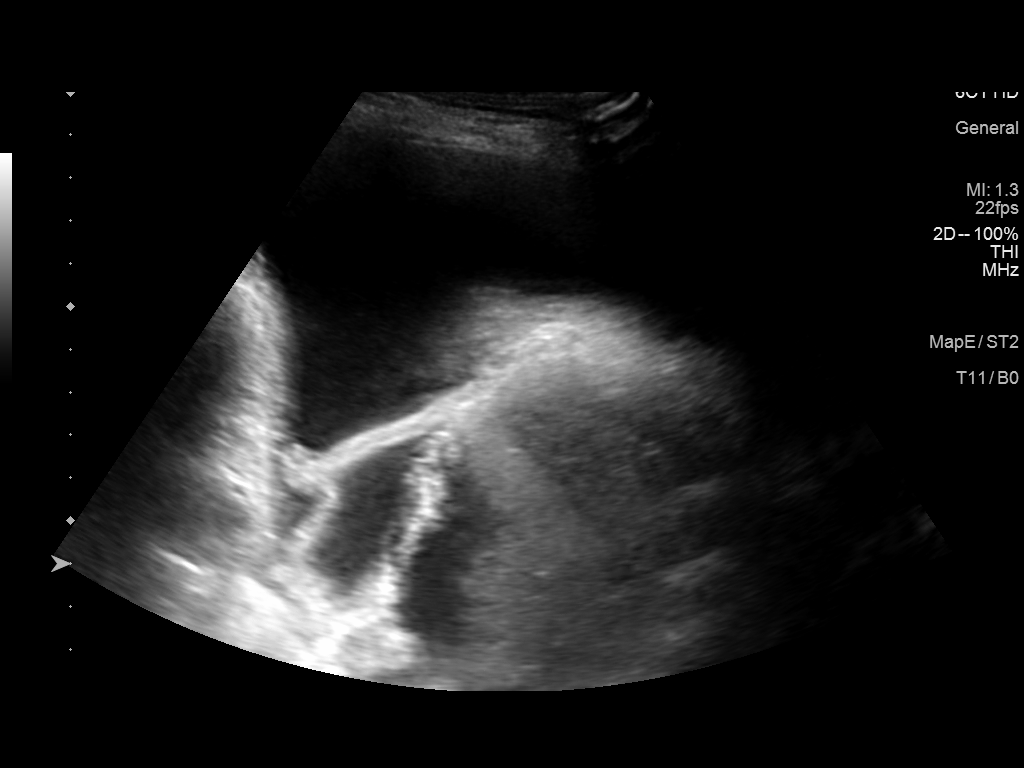
[im 4/7]
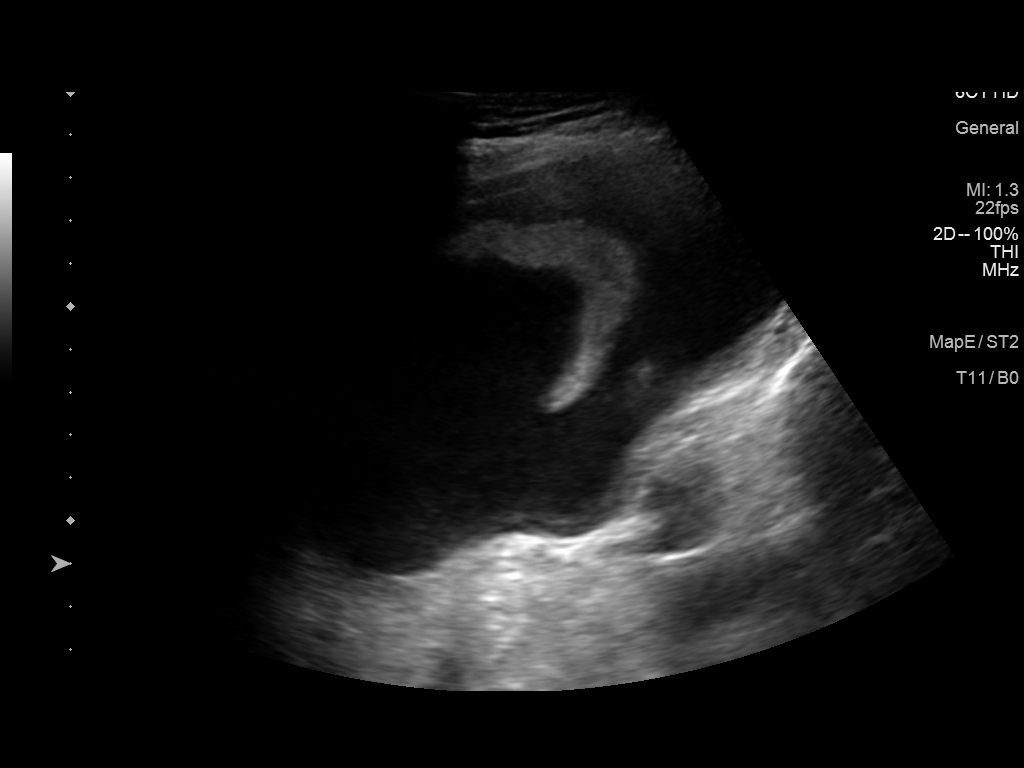
[im 5/7]
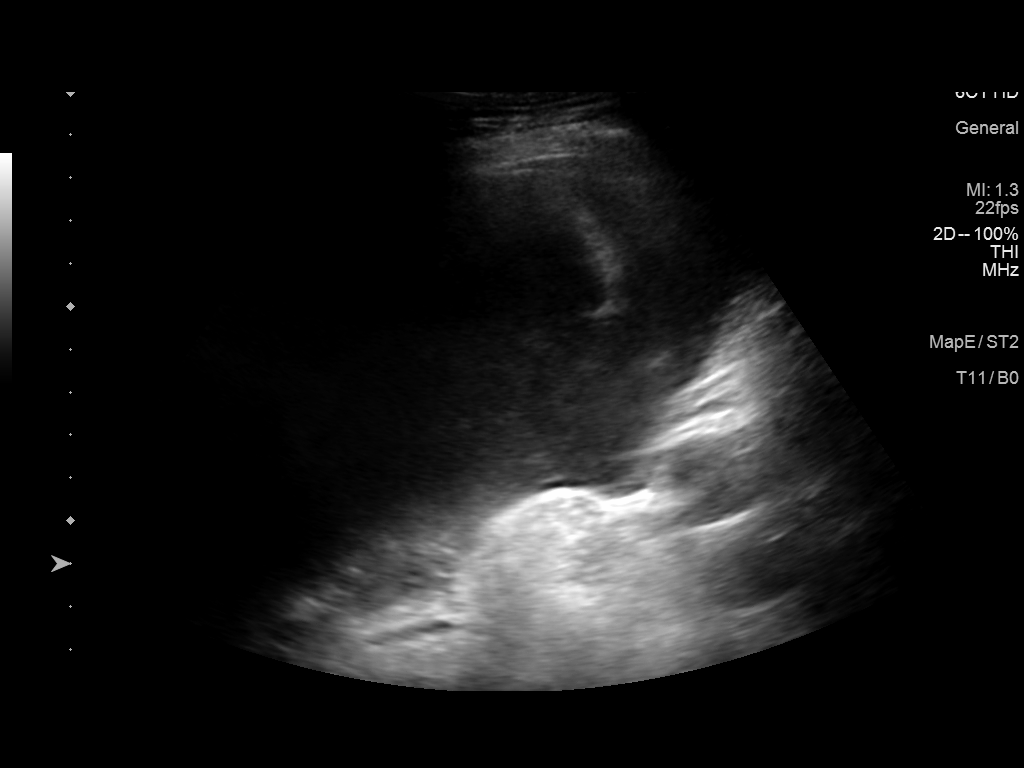
[im 6/7]
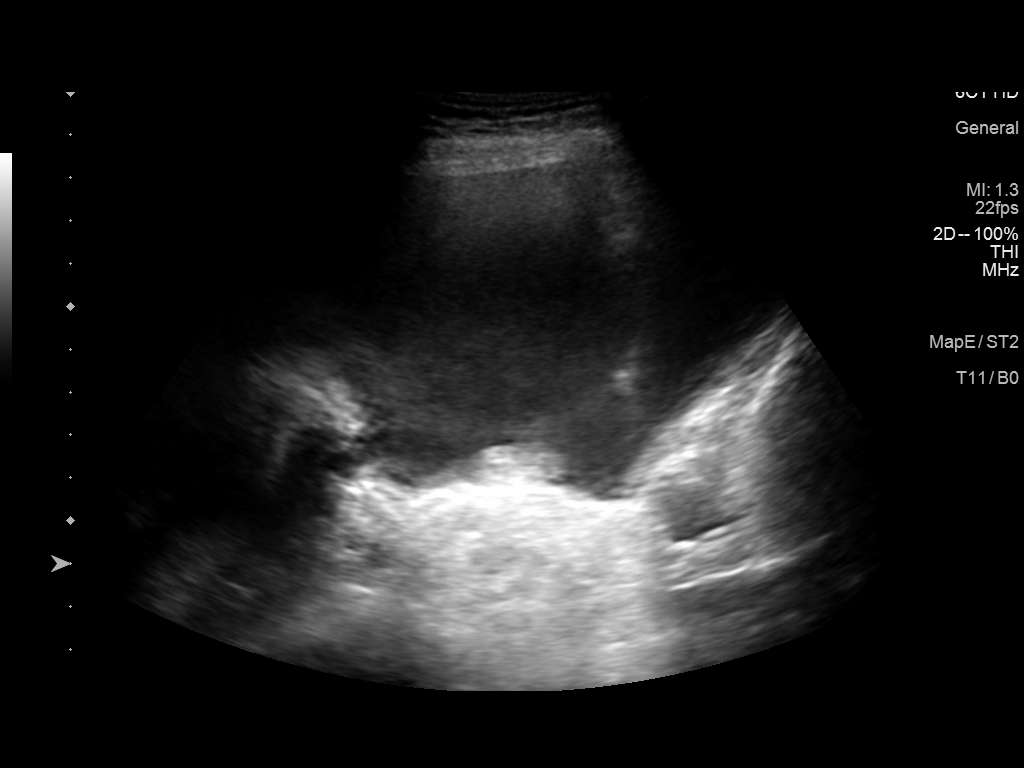
[im 7/7]
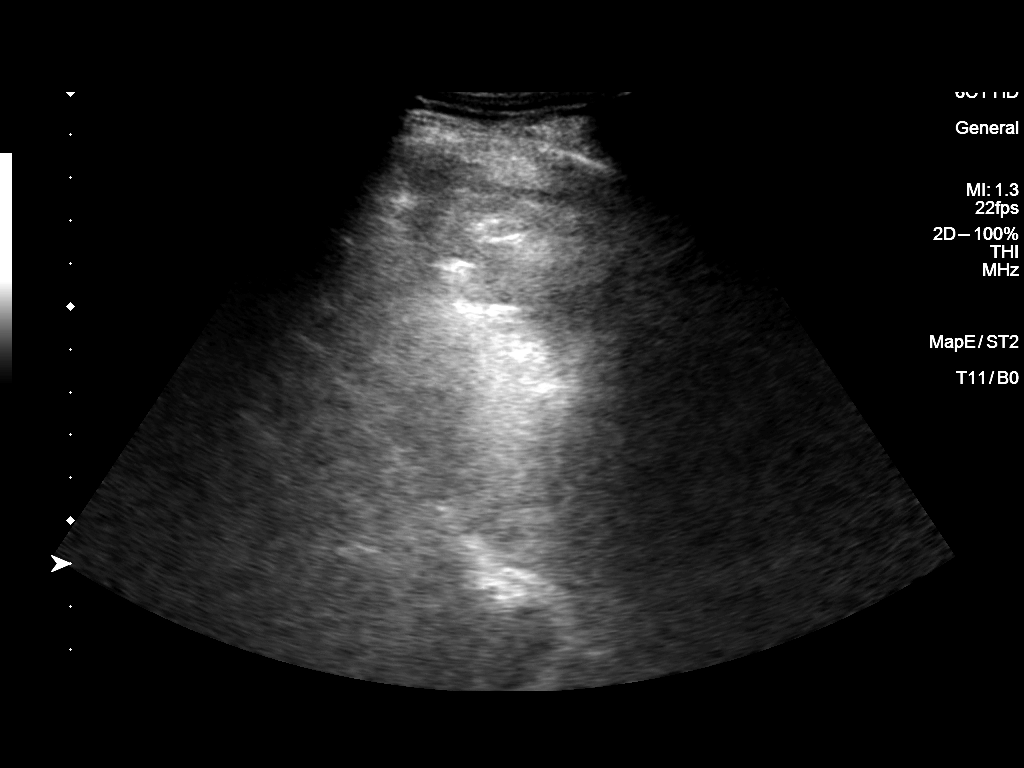

[7 of 7 positions shown; findings below may reference images not displayed]

EXAM:
ULTRASOUND GUIDED DIAGNOSTIC AND THERAPEUTIC RIGHT THORACENTESIS

MEDICATIONS:
None.

COMPLICATIONS:
None immediate.

PROCEDURE:
An ultrasound guided thoracentesis was thoroughly discussed with the
patient and questions answered. The benefits, risks, alternatives
and complications were also discussed. The patient understands and
wishes to proceed with the procedure. Written consent was obtained.

Ultrasound was performed to localize and mark an adequate pocket of
fluid in the right chest. The area was then prepped and draped in
the normal sterile fashion. 1% Lidocaine was used for local
anesthesia. Under ultrasound guidance a Safe-T-Centesis catheter was
introduced. Thoracentesis was performed. The catheter was removed
and a dressing applied.
FINDINGS: A total of approximately 1.2 liters of yellow fluid was removed.
Samples were sent to the laboratory as requested by the clinical
team.
IMPRESSION: Successful ultrasound guided diagnostic and therapeutic right
thoracentesis yielding 1.2 liters of pleural fluid.

## 2016-11-06 IMAGING — DX DG CHEST 1V
1 series · 1 of 1 positions shown · non-contrast
Comparison: 01/19/2016

CLINICAL DATA: Status post right thoracentesis.

EXAM:
CHEST 1 VIEW

[x chest ap]
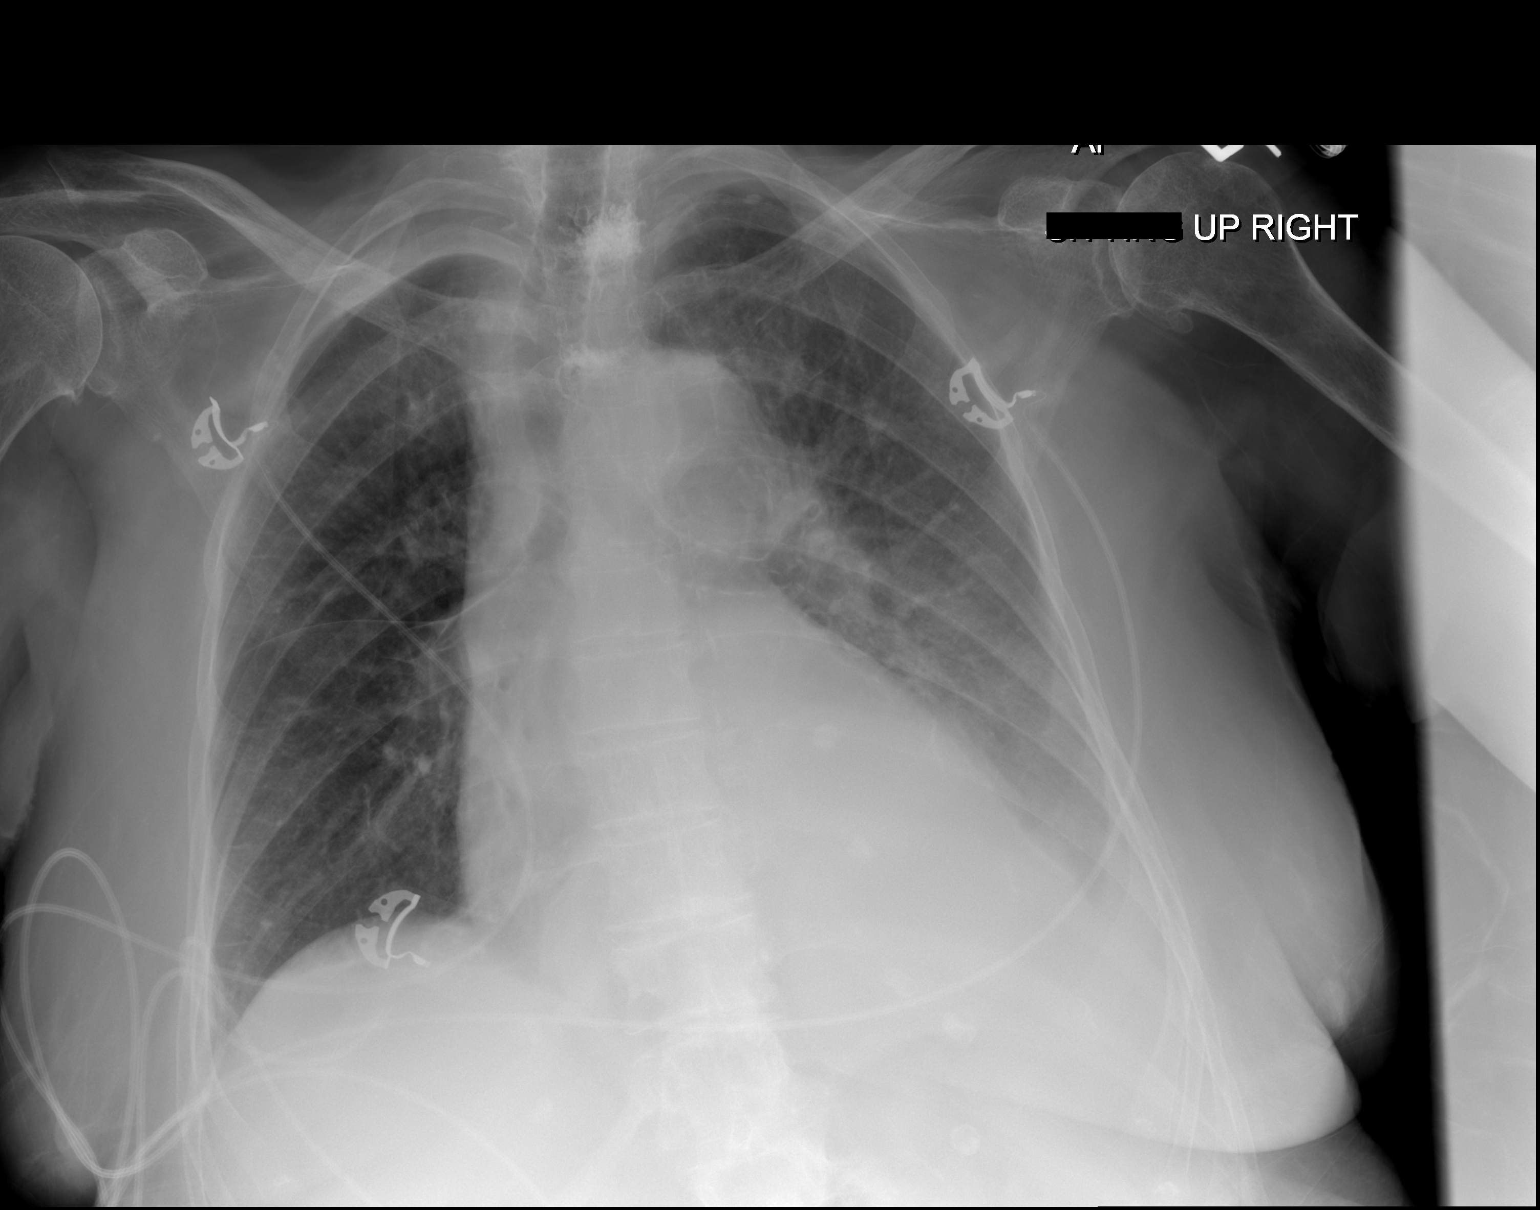

[1 of 1 positions shown; findings below may reference images not displayed]

FINDINGS: The cardiac silhouette is enlarged. Mediastinal contours appear
intact. The aorta is torturous and contains atherosclerotic
calcifications

There is no evidence of pneumothorax. There is decreased right
pleural effusion. There is stable left pleural effusion. Findings of
pulmonary edema are stable. There is left basilar atelectasis.

Osseous structures are without acute abnormality. Kyphoplasty of the
upper thoracic spine is noted. Soft tissues are grossly normal.
IMPRESSION: No evidence of pneumothorax status post right thoracentesis, with
decrease in the size of right pleural effusion.

Persistent moderate in size left pleural effusion with left lung
base atelectasis versus airspace consolidation.
